# Patient Record
Sex: Female | Born: 1960 | Race: White | Hispanic: No | Smoking: Never smoker
Health system: Southern US, Community
[De-identification: ages and names within clinical notes are randomized; demographics above are authoritative.]

## PROBLEM LIST (undated history)

## (undated) DIAGNOSIS — E119 Type 2 diabetes mellitus without complications: Secondary | ICD-10-CM

## (undated) DIAGNOSIS — R6510 Systemic inflammatory response syndrome (SIRS) of non-infectious origin without acute organ dysfunction: Principal | ICD-10-CM

## (undated) DIAGNOSIS — E785 Hyperlipidemia, unspecified: Secondary | ICD-10-CM

## (undated) DIAGNOSIS — J45909 Unspecified asthma, uncomplicated: Secondary | ICD-10-CM

## (undated) DIAGNOSIS — N200 Calculus of kidney: Secondary | ICD-10-CM

## (undated) HISTORY — PX: OOPHORECTOMY: SHX86

## (undated) HISTORY — PX: TONSILLECTOMY AND ADENOIDECTOMY: SUR1326

## (undated) HISTORY — PX: CHOLECYSTECTOMY: SHX55

## (undated) HISTORY — DX: Systemic inflammatory response syndrome (sirs) of non-infectious origin without acute organ dysfunction: R65.10

## (undated) HISTORY — PX: ABDOMINAL HYSTERECTOMY: SHX81

## (undated) HISTORY — PX: BREAST REDUCTION SURGERY: SHX8

## (undated) HISTORY — DX: Unspecified asthma, uncomplicated: J45.909

## (undated) HISTORY — DX: Calculus of kidney: N20.0

## (undated) HISTORY — PX: OTHER SURGICAL HISTORY: SHX169

## (undated) HISTORY — DX: Hyperlipidemia, unspecified: E78.5

## (undated) HISTORY — DX: Type 2 diabetes mellitus without complications: E11.9

## (undated) HISTORY — PX: BLADDER SUSPENSION: SHX72

---

## 2009-08-08 ENCOUNTER — Ambulatory Visit: Payer: Self-pay | Admitting: Urology

## 2009-08-29 ENCOUNTER — Ambulatory Visit: Payer: Self-pay | Admitting: Unknown Physician Specialty

## 2009-09-19 ENCOUNTER — Ambulatory Visit: Payer: Self-pay | Admitting: Gastroenterology

## 2010-07-24 ENCOUNTER — Ambulatory Visit: Payer: Self-pay | Admitting: Family Medicine

## 2011-10-11 HISTORY — PX: SPINAL FUSION: SHX223

## 2011-12-11 HISTORY — PX: LITHOTRIPSY: SUR834

## 2012-06-09 DIAGNOSIS — R6510 Systemic inflammatory response syndrome (SIRS) of non-infectious origin without acute organ dysfunction: Secondary | ICD-10-CM

## 2012-06-09 DIAGNOSIS — N200 Calculus of kidney: Secondary | ICD-10-CM

## 2012-06-09 HISTORY — DX: Systemic inflammatory response syndrome (sirs) of non-infectious origin without acute organ dysfunction: R65.10

## 2012-06-09 HISTORY — DX: Calculus of kidney: N20.0

## 2012-07-08 ENCOUNTER — Inpatient Hospital Stay: Payer: Self-pay | Admitting: Internal Medicine

## 2012-07-08 LAB — CBC
HCT: 38.5 % (ref 35.0–47.0)
HGB: 13 g/dL (ref 12.0–16.0)
MCH: 28 pg (ref 26.0–34.0)
MCHC: 33.8 g/dL (ref 32.0–36.0)
Platelet: 260 10*3/uL (ref 150–440)
RBC: 4.65 10*6/uL (ref 3.80–5.20)
RDW: 14.5 % (ref 11.5–14.5)

## 2012-07-08 LAB — BASIC METABOLIC PANEL
Co2: 24 mmol/L (ref 21–32)
EGFR (African American): >60
EGFR (Non-African Amer.): >60
Osmolality: 284 (ref 275–301)
Potassium: 3.8 mmol/L (ref 3.5–5.1)
Sodium: 135 mmol/L — ABNORMAL LOW (ref 136–145)

## 2012-07-08 LAB — URINALYSIS, COMPLETE
Bilirubin,UR: NEGATIVE
Glucose,UR: >=500 mg/dL (ref 0–75)
Ketone: NEGATIVE
Ph: 6 (ref 4.5–8.0)
RBC,UR: 56 /HPF (ref 0–5)
Squamous Epithelial: <1
WBC UR: 169 /HPF (ref 0–5)

## 2012-07-09 LAB — CBC WITH DIFFERENTIAL/PLATELET
Basophil #: 0 10*3/uL (ref 0.0–0.1)
Eosinophil #: 0.1 10*3/uL (ref 0.0–0.7)
Eosinophil %: 1.4 %
Lymphocyte #: 2.1 10*3/uL (ref 1.0–3.6)
Lymphocyte %: 21.4 %
MCH: 28.1 pg (ref 26.0–34.0)
MCHC: 33.4 g/dL (ref 32.0–36.0)
MCV: 84 fL (ref 80–100)
Monocyte #: 0.8 x10 3/mm (ref 0.2–0.9)
Neutrophil %: 68.4 %
Platelet: 190 10*3/uL (ref 150–440)
RBC: 3.64 10*6/uL — ABNORMAL LOW (ref 3.80–5.20)
RDW: 14.5 % (ref 11.5–14.5)

## 2012-07-09 LAB — BASIC METABOLIC PANEL
Calcium, Total: 7.8 mg/dL — ABNORMAL LOW (ref 8.5–10.1)
Co2: 23 mmol/L (ref 21–32)
EGFR (African American): >60
EGFR (Non-African Amer.): >60
Glucose: 219 mg/dL — ABNORMAL HIGH (ref 65–99)
Osmolality: 287 (ref 275–301)
Potassium: 4.4 mmol/L (ref 3.5–5.1)

## 2012-07-10 LAB — HEMOGLOBIN: HGB: 10.2 g/dL — ABNORMAL LOW (ref 12.0–16.0)

## 2012-07-10 LAB — URINE CULTURE

## 2012-07-10 LAB — HEMOGLOBIN A1C: Hemoglobin A1C: 9.1 % — ABNORMAL HIGH (ref 4.2–6.3)

## 2012-07-13 LAB — CULTURE, BLOOD (SINGLE)

## 2012-07-15 LAB — CULTURE, BLOOD (SINGLE)

## 2012-07-16 ENCOUNTER — Telehealth: Payer: Self-pay

## 2012-07-16 NOTE — Telephone Encounter (Signed)
Ok to make appt sooner but please make sure it is a 45 minute appt and that we have hospital records prior to her appt.

## 2012-07-16 NOTE — Telephone Encounter (Signed)
Patient notified as instructed by telephone. Carrie scheduled new pt 45 min appt 07/24/12 at 10:00 am. Faxed record release to [facility] Medical records/ Jacki Cones notified. Pt instructed if condition changes or worsens prior to appt to go to UC or ER. Pt understood.

## 2012-07-16 NOTE — Telephone Encounter (Signed)
Pt has 30 min new pt to establish appt scheduled with Dr Dayton Martes on 08/28/12 at 2 pm. Pt recently hospitalized [facility] with SIRS,AC,pyelonephritis,staghorn rt kidney stone,leukocytosis, hypotension resolved(during hospitalization BP 52/45; at [facility] pts BP 120/70),fluid overload and DM; hgbA1C 9.1. Pt said she is to follow up with medical doctor in one week. Pt said she has never seen medical doctor.  Pt has appt 07/28/12 with endocrinologist at [facility] and f/u appt with urologist. Jamesetta So, office manager advised to send note to Dr Dayton Martes to see if could work in before 08/28/12. Pt advised if condition changed or worsened before heard back from our office to go to UC or ER. Pt verbalized understanding.Please advise.

## 2012-07-24 ENCOUNTER — Ambulatory Visit (INDEPENDENT_AMBULATORY_CARE_PROVIDER_SITE_OTHER): Payer: BC Managed Care – PPO | Admitting: Family Medicine

## 2012-07-24 ENCOUNTER — Other Ambulatory Visit: Payer: Self-pay | Admitting: Family Medicine

## 2012-07-24 ENCOUNTER — Encounter: Payer: Self-pay | Admitting: Family Medicine

## 2012-07-24 VITALS — BP 118/80 | HR 64 | Temp 98.1°F | Ht 59.75 in | Wt 157.0 lb

## 2012-07-24 DIAGNOSIS — R6510 Systemic inflammatory response syndrome (SIRS) of non-infectious origin without acute organ dysfunction: Secondary | ICD-10-CM

## 2012-07-24 DIAGNOSIS — N200 Calculus of kidney: Secondary | ICD-10-CM

## 2012-07-24 DIAGNOSIS — D239 Other benign neoplasm of skin, unspecified: Secondary | ICD-10-CM

## 2012-07-24 DIAGNOSIS — R197 Diarrhea, unspecified: Secondary | ICD-10-CM | POA: Insufficient documentation

## 2012-07-24 DIAGNOSIS — Z800 Family history of malignant neoplasm of digestive organs: Secondary | ICD-10-CM | POA: Insufficient documentation

## 2012-07-24 DIAGNOSIS — N12 Tubulo-interstitial nephritis, not specified as acute or chronic: Secondary | ICD-10-CM | POA: Insufficient documentation

## 2012-07-24 DIAGNOSIS — E119 Type 2 diabetes mellitus without complications: Secondary | ICD-10-CM

## 2012-07-24 DIAGNOSIS — Z808 Family history of malignant neoplasm of other organs or systems: Secondary | ICD-10-CM | POA: Insufficient documentation

## 2012-07-24 DIAGNOSIS — E1165 Type 2 diabetes mellitus with hyperglycemia: Secondary | ICD-10-CM | POA: Insufficient documentation

## 2012-07-24 DIAGNOSIS — E785 Hyperlipidemia, unspecified: Secondary | ICD-10-CM | POA: Insufficient documentation

## 2012-07-24 DIAGNOSIS — D229 Melanocytic nevi, unspecified: Secondary | ICD-10-CM

## 2012-07-24 LAB — CBC WITH DIFFERENTIAL/PLATELET
Basophils Relative: 0.6 % (ref 0.0–3.0)
HCT: 37.6 % (ref 36.0–46.0)
Hemoglobin: 11.9 g/dL — ABNORMAL LOW (ref 12.0–15.0)
Lymphocytes Relative: 29.4 % (ref 12.0–46.0)
Lymphs Abs: 2.6 10*3/uL (ref 0.7–4.0)
MCHC: 31.8 g/dL (ref 30.0–36.0)
Monocytes Relative: 4.8 % (ref 3.0–12.0)
Neutro Abs: 5 10*3/uL (ref 1.4–7.7)
RBC: 4.57 Mil/uL (ref 3.87–5.11)

## 2012-07-24 LAB — COMPREHENSIVE METABOLIC PANEL
AST: 13 U/L (ref 0–37)
BUN: 11 mg/dL (ref 6–23)
Calcium: 10 mg/dL (ref 8.4–10.5)
Chloride: 106 mEq/L (ref 96–112)
Creatinine, Ser: 0.8 mg/dL (ref 0.4–1.2)
GFR: 76.22 mL/min (ref >60.00)
Total Bilirubin: 1 mg/dL (ref 0.3–1.2)

## 2012-07-24 LAB — LIPID PANEL
HDL: 47.4 mg/dL (ref >39.00)
Total CHOL/HDL Ratio: 3
VLDL: 28.4 mg/dL (ref 0.0–40.0)

## 2012-07-24 MED ORDER — LISINOPRIL 2.5 MG PO TABS: 2.5000 mg | ORAL_TABLET | Freq: Every day | ORAL | Status: DC

## 2012-07-24 NOTE — Patient Instructions (Addendum)
It was wonderful to meet you. Please continue to hold your lisinopril until we get your lab results.  Please stop by to see Shirlee Limerick on your way out to set up your dermatology referral.

## 2012-07-24 NOTE — Progress Notes (Signed)
Subjective:    Patient ID: Patricia Golden, female    DOB: 08/18/1961, 51 y.o.   MRN: [id number]  HPI  51 yo with h/o poorly controlled DM, nephrolithiasis here to establish care and for hospital follow up.  Was admitted to [facility] 7/30- 07/10/2012 for Acute pyelonephritis with right staghorn calculous with subsequent SIRS.  Followed by Dr. Evelene Croon.  CT of abdomen/pelvis from 7/30 showed prominent staghorn caculus on right.  CBC- WBC 13.2, Hbg 13.0 UA was positive. Blood cx neg.  Started on Zosyn, received Rocephin, eventually switched to cipro since staghorn stones typically produce proteus infections. Has follow up with Dr. Artis Flock this afternoon.  She has had significant watery diarrhea for past two weeks with some nausea, no vomiting currently. No blood in stool.  Strong FH of colon CA- she had a colonoscopy in 2011.  DM- a1c 9.1 in hospital.  Started on glipizide 2.5 mg daily and was to continue Metformin 1000 mg twice daily. Has rx for lisinopril 2.5 mg daily but advised not to start until she followed up with me. Checking her CBGs daily now- 108 this am.   HLD- on Crestor 20 mg daily.  Patient Active Problem List  Diagnosis  . SIRS (systemic inflammatory response syndrome)  . Staghorn calculus  . DM (diabetes mellitus)  . Hyperlipidemia  . Pyelonephritis   Past Medical History  Diagnosis Date  . SIRS (systemic inflammatory response syndrome) 06/2012    secondary to pyleonephritis  . Staghorn calculus 06/2012    Dr. Evelene Croon is urologist  . DM (diabetes mellitus)   . Hyperlipidemia    Past Surgical History  Procedure Date  . Cholecystectomy   . Tonsillectomy and adenoidectomy   . Abdominal hysterectomy   . Oophorectomy   . Breast reduction surgery   . Tummy tuck   . Bladder suspension    History  Substance Use Topics  . Smoking status: Never Smoker   . Smokeless tobacco: Never Used  . Alcohol Use: Not on file   Family History  Problem Relation Age of Onset    . Diabetes Mother   . Hyperlipidemia Mother   . Diabetes Father   . Hyperlipidemia Father   . Cancer Sister     melanoma, another sister with colon CA at 22   Allergies not on file Current Outpatient Prescriptions on File Prior to Visit  Medication Sig Dispense Refill  . glipiZIDE (GLUCOTROL XL) 2.5 MG 24 hr tablet Take 2.5 mg by mouth daily.      . rosuvastatin (CRESTOR) 20 MG tablet Take 20 mg by mouth daily.       The PMH, PSH, Social History, Family History, Medications, and allergies have been reviewed in [facility], and have been updated if relevant.   Review of Systems See HPI No dysuria No fever No abdominal pain No CP No SOB       Objective:   Physical Exam BP 118/80  Pulse 64  Temp 98.1 F (36.7 C)  Ht 4' 11.75" (1.518 m)  Wt 157 lb (71.215 kg)  BMI 30.92 kg/m2  General:  Well-developed,well-nourished,in no acute distress; alert,appropriate and cooperative throughout examination Head:  normocephalic and atraumatic.   Eyes:  vision grossly intact, pupils equal, pupils round, and pupils reactive to light.   Ears:  R ear normal and L ear normal.   Nose:  no external deformity.   Mouth:  good dentition.   Lungs:  Normal respiratory effort, chest expands symmetrically. Lungs are clear to auscultation, no crackles  or wheezes. Heart:  Normal rate and regular rhythm. S1 and S2 normal without gallop, murmur, click, rub or other extra sounds. Abdomen:  Bowel sounds positive,abdomen soft and non-tender without masses, organomegaly or hernias noted. Msk:  No deformity or scoliosis noted of thoracic or lumbar spine.   Extremities:  No clubbing, cyanosis, edema, or deformity noted with normal full range of motion of all joints.   Neurologic:  alert & oriented X3 and gait normal.   Skin:  Intact without suspicious lesions or rashes Multiple nevi Psych:  Cognition and judgment appear intact. Alert and cooperative with normal attention span and concentration. No apparent  delusions, illusions, hallucinations    Assessment & Plan:   1. SIRS (systemic inflammatory response syndrome)  Resolved.   2. Staghorn calculus  Follow up with Dr. Evelene Croon today to discuss next step.    3. DM (diabetes mellitus)  Deteriorated. Has appt with endocrinologist in September. Continue to hold metformin until we get her lab results back from today ([facility]).   4. Pyelonephritis  Resolved. On last day of cipro. Comprehensive metabolic panel, CBC with Differential  5. Hyperlipidemia  Lipid Panel  6. Diarrhea  New- may be reaction to cipro but given that she received long course of strong abx, including Zosyn, I would like to rule out C. Diff. Clostridium Difficile by PCR  7. Family history of colon cancer    8. Family history of melanoma  Ambulatory referral to Dermatology  9. Multiple nevi  Ambulatory referral to Dermatology

## 2012-07-24 NOTE — Addendum Note (Signed)
Addended by: Alvina Chou on: 07/24/2012 11:06 AM   Modules accepted: Orders

## 2012-07-25 LAB — CLOSTRIDIUM DIFFICILE EIA: CDIFTX: NEGATIVE

## 2012-08-07 ENCOUNTER — Ambulatory Visit: Payer: Self-pay | Admitting: Urology

## 2012-08-28 ENCOUNTER — Ambulatory Visit: Payer: Self-pay | Admitting: Family Medicine

## 2012-08-28 ENCOUNTER — Ambulatory Visit: Payer: Self-pay | Admitting: Urology

## 2012-08-28 LAB — BASIC METABOLIC PANEL
Anion Gap: 8 (ref 7–16)
BUN: 7 mg/dL (ref 7–18)
Chloride: 107 mmol/L (ref 98–107)
Creatinine: 0.65 mg/dL (ref 0.60–1.30)
Potassium: 3.7 mmol/L (ref 3.5–5.1)

## 2012-09-01 ENCOUNTER — Ambulatory Visit: Payer: Self-pay | Admitting: Urology

## 2012-09-04 ENCOUNTER — Ambulatory Visit: Payer: Self-pay | Admitting: Urology

## 2012-09-12 ENCOUNTER — Other Ambulatory Visit: Payer: Self-pay

## 2012-09-12 MED ORDER — GLIPIZIDE ER 2.5 MG PO TB24: 2.5000 mg | ORAL_TABLET | Freq: Every day | ORAL | Status: DC

## 2012-09-12 NOTE — Telephone Encounter (Signed)
Patient notified as instructed by telephone v/m refill sent to [facility] [place].

## 2012-09-12 NOTE — Telephone Encounter (Signed)
Pt has kidney stone surgery and has not seen endocrinologist. Pt request refill glipizide to [facility] [place].

## 2012-09-17 ENCOUNTER — Telehealth: Payer: Self-pay

## 2012-09-17 NOTE — Telephone Encounter (Signed)
Pt request over phone lipid values done 07/24/12; pt needed for wellness assessment form for insurance. Given from lab report.

## 2012-09-23 ENCOUNTER — Ambulatory Visit (INDEPENDENT_AMBULATORY_CARE_PROVIDER_SITE_OTHER): Payer: BC Managed Care – PPO

## 2012-09-23 DIAGNOSIS — Z23 Encounter for immunization: Secondary | ICD-10-CM

## 2012-09-25 ENCOUNTER — Ambulatory Visit: Payer: Self-pay | Admitting: Urology

## 2012-10-28 ENCOUNTER — Ambulatory Visit: Payer: Self-pay | Admitting: Urology

## 2012-10-30 ENCOUNTER — Ambulatory Visit: Payer: Self-pay | Admitting: Urology

## 2012-11-17 ENCOUNTER — Ambulatory Visit: Payer: Self-pay | Admitting: Urology

## 2013-02-02 ENCOUNTER — Telehealth: Payer: Self-pay | Admitting: Family Medicine

## 2013-02-02 ENCOUNTER — Encounter: Payer: Self-pay | Admitting: Internal Medicine

## 2013-02-02 ENCOUNTER — Ambulatory Visit (INDEPENDENT_AMBULATORY_CARE_PROVIDER_SITE_OTHER): Payer: BC Managed Care – PPO | Admitting: Internal Medicine

## 2013-02-02 VITALS — BP 120/80 | HR 80 | Temp 97.9°F | Wt 161.0 lb

## 2013-02-02 DIAGNOSIS — J45909 Unspecified asthma, uncomplicated: Secondary | ICD-10-CM | POA: Insufficient documentation

## 2013-02-02 MED ORDER — BENZONATATE 200 MG PO CAPS: 200.0000 mg | ORAL_CAPSULE | Freq: Three times a day (TID) | ORAL | Status: DC | PRN

## 2013-02-02 MED ORDER — AMOXICILLIN 500 MG PO TABS: 1000.0000 mg | ORAL_TABLET | Freq: Two times a day (BID) | ORAL | Status: DC

## 2013-02-02 NOTE — Telephone Encounter (Signed)
Patient Information:  Caller Name: Suad  Phone: [phone]  Patient: Karyl, Sharrar  Gender: Female  DOB: 1961/11/14  Age: 52 Years  PCP: Ruthe Mannan [facility])  Pregnant: No  Office Follow Up:  Does the office need to follow up with this patient?: No  Instructions For The Office: N/A   Symptoms  Reason For Call & Symptoms: Pt is calling and states that she has had a cold for the last 3 weeks; cough present 3 weeks as well;  cough is not tight and chest pain with coughing only; coughing up green to yellow;  Reviewed Health History In EMR: Yes  Reviewed Medications In EMR: Yes  Reviewed Allergies In EMR: Yes  Reviewed Surgeries / Procedures: Yes  Date of Onset of Symptoms: 01/12/2013  Treatments Tried: Mucinex  Treatments Tried Worked: Yes OB / GYN:  LMP: Unknown  Guideline(s) Used:  Cough  Disposition Per Guideline:   See Today or Tomorrow in Office  Reason For Disposition Reached:   Continuous (nonstop) coughing interferes with work or school and no improvement using cough treatment per Care Advice  Advice Given:  Prevent Dehydration:  Drink adequate liquids.  Coughing Spasms:  Drink warm fluids. Inhale warm mist (Reason: both relax the airway and loosen up the phlegm).  Suck on cough drops or hard candy to coat the irritated throat.  Call Back If:  You become worse.  Appointment Scheduled:  02/02/2013 16:30:00 Appointment Scheduled Provider:  Tillman Abide [facility])

## 2013-02-02 NOTE — Assessment & Plan Note (Signed)
Persistent productive cough Not tight now---uses the inhaler occ Will Rx amoxil---consider change to levaquin if doesn't respond (she has done well with this in the past) tessalon

## 2013-02-02 NOTE — Progress Notes (Signed)
Subjective:    Patient ID: Patricia Golden, female    DOB: [birth year], 52 y.o.   MRN: [id number]  HPI Got sore throat and cold symptoms 2.5 weeks ago Slept a lot last week Now started with thick green mucus Still with cough and has persistent chest heaviness  No fever No clear SOB but has some wheezing Has used her albuterol inhaler Will cough till she gags in AM Not really congested in head---doesn't seem to have PND Sore throat is better ---just some pain with coughing No ear pain now  Has tried mucinex, alka seltzer sinus/cold, tessalon-- mucinex probably gave her the most help  Current Outpatient Prescriptions on File Prior to Visit  Medication Sig Dispense Refill  . Fluticasone-Salmeterol (ADVAIR) 500-50 MCG/DOSE AEPB Inhale 1 puff into the lungs daily.      Marland Kitchen glipiZIDE (GLUCOTROL XL) 2.5 MG 24 hr tablet Take 1 tablet (2.5 mg total) by mouth daily.  30 tablet  6  . metFORMIN (GLUCOPHAGE) 500 MG tablet Take 2 by mouth twice daily      . rosuvastatin (CRESTOR) 20 MG tablet Take 20 mg by mouth daily.       No current facility-administered medications on file prior to visit.    Allergies  Allergen Reactions  . Codeine Nausea And Vomiting    Past Medical History  Diagnosis Date  . SIRS (systemic inflammatory response syndrome) 06/2012    secondary to pyleonephritis  . Staghorn calculus 06/2012    Dr. Evelene Croon is urologist  . DM (diabetes mellitus)   . Hyperlipidemia     Past Surgical History  Procedure Laterality Date  . Cholecystectomy    . Tonsillectomy and adenoidectomy    . Abdominal hysterectomy    . Oophorectomy    . Breast reduction surgery    . Tummy tuck    . Bladder suspension    . Discectomy l5 s1      required fusion 16 days after surgery    Family History  Problem Relation Age of Onset  . Diabetes Mother   . Hyperlipidemia Mother   . Diabetes Father   . Hyperlipidemia Father   . Cancer Sister     melanoma, another sister with colon CA at 22  .  Cancer Maternal Aunt     colon CA  . Cancer Maternal Uncle     colon CA  . Cancer Maternal Grandmother     History   Social History  . Marital Status: Single    Spouse Name: N/A    Number of Children: N/A  . Years of Education: N/A   Occupational History  . Not on file.   Social History Main Topics  . Smoking status: Never Smoker   . Smokeless tobacco: Never Used  . Alcohol Use: Not on file  . Drug Use: Not on file  . Sexually Active: Not on file   Other Topics Concern  . Not on file   Social History Narrative   Divorced, three children- 6, 83, and 70 yo.   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     Review of Systems Some dizziness when she turns her head No rash No vomiting Some loose stools---related to mucinex    Objective:   Physical Exam  Constitutional: She appears well-developed and well-nourished. No distress.  HENT:  Mouth/Throat: Oropharynx is clear and moist. No oropharyngeal exudate.  No sinus tenderness Mild nasal congestion but not really inflamed TMs normal  Neck: Normal range of motion. Neck supple.  Pulmonary/Chest: Effort normal and breath sounds normal. No respiratory distress. She has no wheezes. She has no rales.  Lymphadenopathy:    She has no cervical adenopathy.  Psychiatric: She has a normal mood and affect. Her behavior is normal.          Assessment & Plan:

## 2013-03-03 ENCOUNTER — Telehealth: Payer: Self-pay | Admitting: Family Medicine

## 2013-03-03 NOTE — Telephone Encounter (Signed)
Patient Information:  Caller Name: [place]  Phone: [phone]  Patient: Patricia Golden, Patricia Golden  Gender: Female  DOB: 01-Sep-1961  Age: 52 Years  PCP: Ruthe Mannan [facility])  Pregnant: No  Office Follow Up:  Does the office need to follow up with this patient?: No  Instructions For The Office: N/A  RN Note:  Advised she could also soak in bath with baking soda added to the water to help with itching/burning. Patient verbalized understanding and will call if no improvement in 3 days.  Symptoms  Reason For Call & Symptoms: Reports burning and itching in the vaginal area.  Reviewed Health History In EMR: Yes  Reviewed Medications In EMR: Yes  Reviewed Allergies In EMR: Yes  Reviewed Surgeries / Procedures: Yes  Date of Onset of Symptoms: 02/03/2013  Treatments Tried: Vagisil and Neosporin  Treatments Tried Worked: No OB / GYN:  LMP: Unknown  Guideline(s) Used:  Vaginal Discharge  Disposition Per Guideline:   See Within 3 Days in Office  Reason For Disposition Reached:   Diabetes mellitus or weak immune system (e.g., HIV positive, cancer chemo, splenectomy, organ transplant, chronic steroids)  Advice Given:  Genital Hygiene:   Keep your genital area clean. Wash daily.  Keep your genital area dry. Wear cotton underwear or underwear with a cotton crotch.  Do not use feminine hygiene products.  Antifungal Medication for Vaginal Yeast Infection:   Available in the [place]: Femstat-3, miconazole (Monistat-3), clotrimazole (Gyne-Lotrimin-3, Mycelex-7), butoconazole (Femstat-3).  Read the package instructions thoroughly on all medications that you take.  Call Back If:  There is no improvement after treating yourself for a vaginal yeast infection  You become worse.  Patient Will Follow Care Advice:  YES

## 2013-05-15 LAB — HM DIABETES EYE EXAM: HM Diabetic Eye Exam: NORMAL

## 2013-06-18 ENCOUNTER — Encounter: Payer: Self-pay | Admitting: Family Medicine

## 2013-06-18 ENCOUNTER — Ambulatory Visit (INDEPENDENT_AMBULATORY_CARE_PROVIDER_SITE_OTHER): Payer: BC Managed Care – PPO | Admitting: Family Medicine

## 2013-06-18 ENCOUNTER — Other Ambulatory Visit: Payer: Self-pay

## 2013-06-18 VITALS — BP 110/80 | HR 80 | Temp 98.1°F | Wt 154.0 lb

## 2013-06-18 DIAGNOSIS — R197 Diarrhea, unspecified: Secondary | ICD-10-CM

## 2013-06-18 MED ORDER — METFORMIN HCL 500 MG PO TABS: ORAL_TABLET | ORAL | Status: DC

## 2013-06-18 NOTE — Patient Instructions (Addendum)
Food Poisoning °Food poisoning is an illness caused by something you ate or drank. There are over 250 known causes of food poisoning. However, many other causes are unknown. You can be treated even if the exact cause of your food poisoning is not known. In most cases, food poisoning is mild and lasts 1 to 2 days. However, some cases can be serious, especially for people with low immune systems, the elderly, children and infants, and pregnant women. °CAUSES  °Poor personal hygiene, improper cleaning of storage and preparation areas, and unclean utensils can cause infection or tainting (contamination) of foods. The causes of food poisoning are numerous. Infectious agents, such as viruses, bacteria, or parasites, can cause harm by infecting the intestine and disrupting the absorption of nutrients and water. This can cause diarrhea and lead to dehydration. Viruses are responsible for most of the food poisonings in which an agent is found. Parasites are less likely to cause food poisoning. Toxic agents, such as poisonous mushrooms, marine algae, and pesticides can also cause food poisoning. °· Viral causes of food poisoning include: °· Norovirus. °· Rotavirus. °· Hepatitis A. °· Bacterial causes of food poisoning include: °· Salmonellae. °· Campylobacter. °· Bacillus cereus. °· Escherichia coli (E. coli). °· Shigella. °· Listeria monocytogenes. °· Clostridium botulinum (botulism). °· Vibrio cholerae. °· Parasites that can cause food poisoning include: °· Giardia. °· Cryptosporidium. °· Toxoplasma. °SYMPTOMS °Symptoms may appear several hours or longer after consuming the contaminated food or drink. Symptoms may include: °· Nausea. °· Vomiting. °· Cramping. °· Diarrhea. °· Fever and chills. °· Muscle aches. °DIAGNOSIS °Your caregiver may be able to diagnose food poisoning from a list of what you have recently eaten and results from lab tests. Diagnostic tests may include an exam of the feces. °TREATMENT °In most cases,  treatment focuses on helping to relieve your symptoms and staying well hydrated. Antibiotics are rarely needed. In severe cases, hospitalization may be required. °PREVENTION  °· Wash your hands, food preparation surfaces, and utensils thoroughly before and after handling raw foods. °· Keep refrigerated foods below 40° F (5° C). °· Serve hot foods immediately or keep them heated above 140° F (60° C). °· Divide large volumes of food into small portions for rapid cooling in the refrigerator. Hot, bulky foods in the refrigerator can raise the temperature of other foods that have already cooled. °· Follow approved canning procedures. °· Heat canned foods thoroughly before tasting. °· When in doubt, throw it out. °· Infants, the elderly, women who are pregnant, and people with compromised immune systems are especially susceptible to food poisoning. These people should never consume unpasteurized cheese, unpasteurized cider, raw fish, raw seafood, or raw meat type products. °HOME CARE INSTRUCTIONS  °· Drink enough water and fluids to keep your urine clear or pale yellow. Drink small amounts of fluids frequently and increase as tolerated. °· Ask your caregiver for specific rehydration instructions. °· Avoid: °· Foods high in sugar. °· Alcohol. °· Carbonated drinks. °· Tobacco. °· Juice. °· Caffeine drinks. °· Extremely hot or cold fluids. °· Fatty, greasy foods. °· Too much intake of anything at one time. °· Dairy products until 24 to 48 hours after diarrhea stops. °· You may consume probiotics. Probiotics are active cultures of beneficial bacteria. They may lessen the amount and number of diarrheal stools in adults. Probiotics can be found in yogurt with active cultures and in supplements. °· Wash your hands well to avoid spreading the bacteria. °· Only take over-the-counter or prescription medicines for pain, discomfort,   or fever as directed by your caregiver. Do not give aspirin to children. °· Ask your caregiver if you  should continue to take your regular prescribed and over-the-counter medicines. °SEEK IMMEDIATE MEDICAL CARE IF:  °· You have difficulty breathing, swallowing, talking, or moving. °· You develop blurred vision. °· You are unable to keep fluids down. °· You faint or nearly faint. °· Your eyes turn yellow. °· Vomiting or diarrhea develops or becomes persistent. °· Abdominal pain develops, increases, or localizes in one small area. °· You have a fever. °· The diarrhea becomes excessive or contains blood or mucus. °· You develop excessive weakness, dizziness, or extreme thirst. °· You have no urine for 8 hours. °MAKE SURE YOU:  °· Understand these instructions. °· Will watch your condition. °· Will get help right away if you are not doing well or get worse. °Document Released: 08/24/2004 Document Revised: 02/18/2012 Document Reviewed: 04/12/2011 °ExitCare® Patient Information ©2014 [employer]. ° °

## 2013-06-18 NOTE — Progress Notes (Signed)
Subjective:    Patient ID: Patricia Golden, female    DOB: Sep 30, 1961, 52 y.o.   MRN: [id number]  HPI  52 yo here for vomiting, diarrhea, fever (tmax 100.5) and chills x 4 days.  Started a few hours after eating a chicken biscuit at [employer].  Husband has same symptoms. Vomiting stopped two days ago.  Last fever was two days ago as well.  Still having frequent and loose stools but they are improving. No blood in stool.  No black stools.  She is staying hydrated.  Was drinking pedialyte yesterday.  Appetite is decreased but she is eating.  Patient Active Problem List   Diagnosis Date Noted  . Asthmatic bronchitis 02/02/2013  . Pyelonephritis 07/24/2012  . Diarrhea 07/24/2012  . Family history of colon cancer 07/24/2012  . Family history of melanoma 07/24/2012  . Staghorn calculus   . DM (diabetes mellitus)   . Hyperlipidemia    Past Medical History  Diagnosis Date  . SIRS (systemic inflammatory response syndrome) 06/2012    secondary to pyleonephritis  . Staghorn calculus 06/2012    Dr. Evelene Croon is urologist  . DM (diabetes mellitus)   . Hyperlipidemia    Past Surgical History  Procedure Laterality Date  . Cholecystectomy    . Tonsillectomy and adenoidectomy    . Abdominal hysterectomy    . Oophorectomy    . Breast reduction surgery    . Tummy tuck    . Bladder suspension    . Discectomy l5 s1      required fusion 16 days after surgery   History  Substance Use Topics  . Smoking status: Never Smoker   . Smokeless tobacco: Never Used  . Alcohol Use: Not on file   Family History  Problem Relation Age of Onset  . Diabetes Mother   . Hyperlipidemia Mother   . Diabetes Father   . Hyperlipidemia Father   . Cancer Sister     melanoma, another sister with colon CA at 42  . Cancer Maternal Aunt     colon CA  . Cancer Maternal Uncle     colon CA  . Cancer Maternal Grandmother    Allergies  Allergen Reactions  . Codeine Nausea And Vomiting   Current Outpatient  Prescriptions on File Prior to Visit  Medication Sig Dispense Refill  . Fluticasone-Salmeterol (ADVAIR) 500-50 MCG/DOSE AEPB Inhale 1 puff into the lungs daily.      Marland Kitchen glipiZIDE (GLUCOTROL XL) 2.5 MG 24 hr tablet Take 1 tablet (2.5 mg total) by mouth daily.  30 tablet  6   No current facility-administered medications on file prior to visit.   The PMH, PSH, Social History, Family History, Medications, and allergies have been reviewed in [facility], and have been updated if relevant.   Review of Systems See HPI    Objective:   Physical Exam BP 110/80  Pulse 80  Temp(Src) 98.1 F (36.7 C)  Wt 154 lb (69.854 kg)  BMI 30.31 kg/m2  General:  Well-developed,well-nourished,in no acute distress; alert,appropriate and cooperative throughout examination Head:  normocephalic and atraumatic.   Eyes:  vision grossly intact, pupils equal, pupils round, and pupils reactive to light.   Lungs:  Normal respiratory effort, chest expands symmetrically. Lungs are clear to auscultation, no crackles or wheezes. Heart:  Normal rate and regular rhythm. S1 and S2 normal without gallop, murmur, click, rub or other extra sounds. Abdomen:  Bowel sounds positive,abdomen soft and non-tender without masses, organomegaly or hernias noted. Extremities:  No clubbing,  cyanosis, edema, or deformity noted with normal full range of motion of all joints.   Neurologic:  alert & oriented X3 and gait normal.   Skin:  Intact without suspicious lesions or rashes Psych:  Cognition and judgment appear intact. Alert and cooperative with normal attention span and concentration. No apparent delusions, illusions, hallucinations     Assessment & Plan:  1. Diarrhea With vomiting. Consistent with food poisoning. Reassurance provided- symptoms resolving and she is hydrated. See AVS for supportive care. Call or return to clinic prn if these symptoms worsen or fail to improve as anticipated. The patient indicates understanding of these  issues and agrees with the plan.

## 2013-06-22 ENCOUNTER — Ambulatory Visit (INDEPENDENT_AMBULATORY_CARE_PROVIDER_SITE_OTHER): Payer: BC Managed Care – PPO | Admitting: Family Medicine

## 2013-06-22 ENCOUNTER — Encounter: Payer: Self-pay | Admitting: Family Medicine

## 2013-06-22 ENCOUNTER — Telehealth: Payer: Self-pay | Admitting: Family Medicine

## 2013-06-22 VITALS — BP 120/70 | HR 82 | Temp 98.1°F | Ht 59.75 in | Wt 155.0 lb

## 2013-06-22 DIAGNOSIS — R300 Dysuria: Secondary | ICD-10-CM

## 2013-06-22 DIAGNOSIS — N10 Acute tubulo-interstitial nephritis: Secondary | ICD-10-CM

## 2013-06-22 DIAGNOSIS — N200 Calculus of kidney: Secondary | ICD-10-CM

## 2013-06-22 LAB — POCT URINALYSIS DIPSTICK
Bilirubin, UA: NEGATIVE
Nitrite, UA: NEGATIVE
Protein, UA: 30
Urobilinogen, UA: NEGATIVE
pH, UA: 6

## 2013-06-22 MED ORDER — OXYCODONE-ACETAMINOPHEN 5-325 MG PO TABS: 1.0000 | ORAL_TABLET | ORAL | Status: DC | PRN

## 2013-06-22 MED ORDER — TAMSULOSIN HCL 0.4 MG PO CAPS: 0.4000 mg | ORAL_CAPSULE | Freq: Every day | ORAL | Status: DC

## 2013-06-22 MED ORDER — CIPROFLOXACIN HCL 500 MG PO TABS: 500.0000 mg | ORAL_TABLET | Freq: Two times a day (BID) | ORAL | Status: DC

## 2013-06-22 NOTE — Patient Instructions (Addendum)
REFERRAL: GO THE THE FRONT ROOM AT THE ENTRANCE OF OUR CLINIC, NEAR CHECK IN. ASK FOR Patricia Golden. SHE WILL HELP YOU SET UP YOUR REFERRAL. DATE: TIME:  

## 2013-06-22 NOTE — Telephone Encounter (Signed)
Confidential Office Message [address] [place], [place] [address] p. [phone] f. [phone] To: [facility] (After Hours Triage) Fax: [phone] From: [facility] Date/ Time: 06/22/2013 8:33 AM Taken By: Di Kindle, RN Caller: Demetrios Isaacs Facility: Not Collected Patient: Patricia Golden, Patricia Golden DOB: 1961/09/27 Phone: [phone] Reason for Call: Caller was unable to be reached on callback - Left Message Regarding Appointment: No Appt Date: Appt Time: Unknown Provider: Reason: Details: Outcome: Confidential

## 2013-06-22 NOTE — Progress Notes (Signed)
Nature conservation officer at [facility] [address] [place] [address] Phone: [phone] Fax: [phone]  Date:  06/22/2013   Name:  Patricia Golden   DOB:  29-Jun-1961   MRN:  [id number] Gender: female Age: 52 y.o.  Primary Physician:  Ruthe Mannan, MD  Evaluating MD: Hannah Beat, MD   Chief Complaint: Nephrolithiasis   History of Present Illness:  Patricia Golden is a 52 y.o. pleasant patient who presents with the following:  S/p 4 spine surgeries a few years ago.   Last fall, the patient had a persistent kidney stone and actually had pyelo and developed urosepsis.  Came in last week, thought had food poisoning. Fever spiked and started to develop some sweats in her right side. Last August had a large kidney stone, SIRS, BP really low. When the urologist came in, symptoms now are experiencing. Chills, fever last few days.  Back / flank pain on the right. She also has passed several stones - caught in her strainer.   Patient Active Problem List   Diagnosis Date Noted  . Asthmatic bronchitis 02/02/2013  . Pyelonephritis 07/24/2012  . Diarrhea 07/24/2012  . Family history of colon cancer 07/24/2012  . Family history of melanoma 07/24/2012  . Staghorn calculus   . DM (diabetes mellitus)   . Hyperlipidemia     Past Medical History  Diagnosis Date  . SIRS (systemic inflammatory response syndrome) 06/2012    secondary to pyleonephritis  . Staghorn calculus 06/2012    Dr. Evelene Croon is urologist  . DM (diabetes mellitus)   . Hyperlipidemia     Past Surgical History  Procedure Laterality Date  . Cholecystectomy    . Tonsillectomy and adenoidectomy    . Abdominal hysterectomy    . Oophorectomy    . Breast reduction surgery    . Tummy tuck    . Bladder suspension    . Discectomy l5 s1      required fusion 16 days after surgery    History   Social History  . Marital Status: Divorced    Spouse Name: N/A    Number of Children: N/A  . Years of Education:  N/A   Occupational History  . Not on file.   Social History Main Topics  . Smoking status: Never Smoker   . Smokeless tobacco: Never Used  . Alcohol Use: Not on file  . Drug Use: Not on file  . Sexually Active: Not on file   Other Topics Concern  . Not on file   Social History Narrative   Divorced, three children- 32, 7, and 35 yo.   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    Family History  Problem Relation Age of Onset  . Diabetes Mother   . Hyperlipidemia Mother   . Diabetes Father   . Hyperlipidemia Father   . Cancer Sister     melanoma, another sister with colon CA at 3  . Cancer Maternal Aunt     colon CA  . Cancer Maternal Uncle     colon CA  . Cancer Maternal Grandmother     Allergies  Allergen Reactions  . Codeine Nausea And Vomiting    Medication list has been reviewed and updated.  Outpatient Prescriptions Prior to Visit  Medication Sig Dispense Refill  . Fluticasone-Salmeterol (ADVAIR) 500-50 MCG/DOSE AEPB Inhale 1 puff into the lungs daily.      Marland Kitchen glipiZIDE (GLUCOTROL XL) 2.5 MG 24 hr tablet Take 1 tablet (2.5 mg total) by mouth  daily.  30 tablet  6  . metFORMIN (GLUCOPHAGE) 500 MG tablet Take 2 by mouth twice daily  120 tablet  3   No facility-administered medications prior to visit.    Review of Systems:  ROS: GEN: Acute illness details above GI: Tolerating PO intake, decreased, occ nausea GU: maintaining adequate hydration and urination Pulm: No SOB Interactive and getting along well at home.  Otherwise, ROS is as per the HPI.   Physical Examination: BP 120/70  Pulse 82  Temp(Src) 98.1 F (36.7 C) (Oral)  Ht 4' 11.75" (1.518 m)  Wt 155 lb (70.308 kg)  BMI 30.51 kg/m2  SpO2 99%  Ideal Body Weight: Weight in (lb) to have BMI = 25: 126.7  GEN: WDWN, NAD, Non-toxic, A & O x 3 HEENT: Atraumatic, Normocephalic. Neck supple. No masses, No LAD. Ears and Nose: No external deformity. CV: RRR, No M/G/R. No JVD. No thrill. No extra heart  sounds. PULM: CTA B, no wheezes, crackles, rhonchi. No retractions. No resp. distress. No accessory muscle use. ABD: S, mild RLQ tenderness, R flank tenderness, ND, +BS. No rebound. No HSM. + CVAT on the RIGHT EXTR: No c/c/e NEURO Normal gait.  PSYCH: Normally interactive. Conversant. Not depressed or anxious appearing.  Calm demeanor.    Assessment and Plan:  Acute pyelonephritis  Dysuria - Plan: POCT Urinalysis Dipstick, Urine culture, CBC with Differential, Basic metabolic panel, Hepatic function panel, Ambulatory referral to Urology, CANCELED: CT Abdomen Pelvis Wo Contrast  Nephrolithiasis - Plan: Ambulatory referral to Urology, CANCELED: CT Abdomen Pelvis Wo Contrast  Several days history, intermittent fever, sounds to have pyelo by history and exam. Complicated by complex stone history. Start cipro.   Check labs Flomax and prn pain medication.  Given case and recent history, I went ahead and got urology involved, who will see her 06/23/2013.  Orders Today:  Orders Placed This Encounter  Procedures  . Urine culture  . CBC with Differential  . Basic metabolic panel  . Hepatic function panel  . Ambulatory referral to Urology    Referral Priority:  Routine    Referral Type:  Consultation    Referral Reason:  Specialty Services Required    Requested Specialty:  Urology    Number of Visits Requested:  1  . POCT Urinalysis Dipstick    Updated Medication List: (Includes new medications, updates to list, dose adjustments) Meds ordered this encounter  Medications  . tamsulosin (FLOMAX) 0.4 MG CAPS    Sig: Take 1 capsule (0.4 mg total) by mouth daily.    Dispense:  30 capsule    Refill:  0  . ciprofloxacin (CIPRO) 500 MG tablet    Sig: Take 1 tablet (500 mg total) by mouth 2 (two) times daily.    Dispense:  14 tablet    Refill:  0  . oxyCODONE-acetaminophen (PERCOCET/ROXICET) 5-325 MG per tablet    Sig: Take 1 tablet by mouth every 4 (four) hours as needed for pain.     Dispense:  30 tablet    Refill:  0    Medications Discontinued: There are no discontinued medications.    Signed, Elpidio Galea. Romey Mathieson, MD 06/22/2013 4:04 PM

## 2013-06-23 LAB — CBC WITH DIFFERENTIAL/PLATELET
Eosinophils Absolute: 0.4 10*3/uL (ref 0.0–0.7)
Eosinophils Relative: 3.6 % (ref 0.0–5.0)
HCT: 38.9 % (ref 36.0–46.0)
Lymphs Abs: 2.9 10*3/uL (ref 0.7–4.0)
MCHC: 33.4 g/dL (ref 30.0–36.0)
MCV: 81.7 fl (ref 78.0–100.0)
Monocytes Absolute: 0.3 10*3/uL (ref 0.1–1.0)
Platelets: 316 10*3/uL (ref 150.0–400.0)
RDW: 14.3 % (ref 11.5–14.6)
WBC: 9.9 10*3/uL (ref 4.5–10.5)

## 2013-06-23 LAB — BASIC METABOLIC PANEL
Calcium: 9.7 mg/dL (ref 8.4–10.5)
GFR: 82.72 mL/min (ref >60.00)
Glucose, Bld: 256 mg/dL — ABNORMAL HIGH (ref 70–99)
Sodium: 137 mEq/L (ref 135–145)

## 2013-06-23 LAB — HEPATIC FUNCTION PANEL
Albumin: 3.7 g/dL (ref 3.5–5.2)
Alkaline Phosphatase: 121 U/L — ABNORMAL HIGH (ref 39–117)

## 2013-09-14 ENCOUNTER — Encounter: Payer: Self-pay | Admitting: Family Medicine

## 2013-09-14 ENCOUNTER — Ambulatory Visit (INDEPENDENT_AMBULATORY_CARE_PROVIDER_SITE_OTHER): Payer: BC Managed Care – PPO | Admitting: Family Medicine

## 2013-09-14 VITALS — BP 122/88 | HR 64 | Temp 98.4°F | Wt 158.1 lb

## 2013-09-14 DIAGNOSIS — N12 Tubulo-interstitial nephritis, not specified as acute or chronic: Secondary | ICD-10-CM

## 2013-09-14 DIAGNOSIS — E119 Type 2 diabetes mellitus without complications: Secondary | ICD-10-CM

## 2013-09-14 DIAGNOSIS — E785 Hyperlipidemia, unspecified: Secondary | ICD-10-CM

## 2013-09-14 DIAGNOSIS — Z23 Encounter for immunization: Secondary | ICD-10-CM

## 2013-09-14 LAB — LIPID PANEL
Cholesterol: 299 mg/dL — ABNORMAL HIGH (ref 0–200)
HDL: 50.9 mg/dL (ref >39.00)
Total CHOL/HDL Ratio: 6
Triglycerides: 214 mg/dL — ABNORMAL HIGH (ref 0.0–149.0)
VLDL: 42.8 mg/dL — ABNORMAL HIGH (ref 0.0–40.0)

## 2013-09-14 LAB — HM DIABETES FOOT EXAM: HM Diabetic Foot Exam: NORMAL

## 2013-09-14 LAB — COMPREHENSIVE METABOLIC PANEL
ALT: 34 U/L (ref 0–35)
Alkaline Phosphatase: 84 U/L (ref 39–117)
BUN: 10 mg/dL (ref 6–23)
CO2: 26 mEq/L (ref 19–32)
Calcium: 9.2 mg/dL (ref 8.4–10.5)
Chloride: 102 mEq/L (ref 96–112)
Creatinine, Ser: 0.7 mg/dL (ref 0.4–1.2)
GFR: 90.64 mL/min (ref >60.00)
Glucose, Bld: 299 mg/dL — ABNORMAL HIGH (ref 70–99)
Sodium: 135 mEq/L (ref 135–145)
Total Bilirubin: 1.4 mg/dL — ABNORMAL HIGH (ref 0.3–1.2)
Total Protein: 7 g/dL (ref 6.0–8.3)

## 2013-09-14 LAB — LDL CHOLESTEROL, DIRECT: Direct LDL: 216.4 mg/dL

## 2013-09-14 LAB — MICROALBUMIN / CREATININE URINE RATIO
Creatinine,U: 145.5 mg/dL
Microalb, Ur: 5 mg/dL — ABNORMAL HIGH (ref 0.0–1.9)

## 2013-09-14 MED ORDER — FLUTICASONE-SALMETEROL 500-50 MCG/DOSE IN AEPB: 1.0000 | INHALATION_SPRAY | Freq: Every day | RESPIRATORY_TRACT | Status: DC

## 2013-09-14 MED ORDER — ALBUTEROL SULFATE HFA 108 (90 BASE) MCG/ACT IN AERS: 2.0000 | INHALATION_SPRAY | Freq: Four times a day (QID) | RESPIRATORY_TRACT | Status: DC | PRN

## 2013-09-14 MED ORDER — METFORMIN HCL 1000 MG PO TABS: ORAL_TABLET | ORAL | Status: DC

## 2013-09-14 MED ORDER — ROSUVASTATIN CALCIUM 20 MG PO TABS: 20.0000 mg | ORAL_TABLET | Freq: Every day | ORAL | Status: DC

## 2013-09-14 MED ORDER — GLIPIZIDE ER 2.5 MG PO TB24: 2.5000 mg | ORAL_TABLET | Freq: Every day | ORAL | Status: DC

## 2013-09-14 MED ORDER — METFORMIN HCL 500 MG PO TABS: ORAL_TABLET | ORAL | Status: DC

## 2013-09-14 NOTE — Progress Notes (Signed)
Subjective:    Patient ID: Patricia Golden, female    DOB: 30-Oct-1961, 52 y.o.   MRN: [id number]  HPI  52 yo with h/o poorly controlled DM, nephrolithiasis, HLD here for "med refills."   DM-  Has not returned for follow up. Last a1c was last summer and was 9.1.  On glipizide 2.5 mg daily and was to continue Metformin 1000 mg twice daily.  Admits to forgetting glipizide.  Checking her FSBS irregularly.  Normally in mid 100s.  Denies any episodes of hypoglycemia.  HLD- supposed to be on Crestor 20 mg daily.  Stopped taking it months ago- not sure why.  No side effects. Lab Results  Component Value Date   CHOL 125 07/24/2012   HDL 47.40 07/24/2012   LDLCALC 49 07/24/2012   TRIG 142.0 07/24/2012   CHOLHDL 3 07/24/2012   No recurrent issues with kidney stones.  Patient Active Problem List   Diagnosis Date Noted  . Pyelonephritis 07/24/2012  . Family history of colon cancer 07/24/2012  . Family history of melanoma 07/24/2012  . Staghorn calculus   . DM (diabetes mellitus)   . Hyperlipidemia    Past Medical History  Diagnosis Date  . SIRS (systemic inflammatory response syndrome) 06/2012    secondary to pyleonephritis  . Staghorn calculus 06/2012    Dr. Evelene Croon is urologist  . DM (diabetes mellitus)   . Hyperlipidemia    Past Surgical History  Procedure Laterality Date  . Cholecystectomy    . Tonsillectomy and adenoidectomy    . Abdominal hysterectomy    . Oophorectomy    . Breast reduction surgery    . Tummy tuck    . Bladder suspension    . Discectomy l5 s1      required fusion 16 days after surgery   History  Substance Use Topics  . Smoking status: Never Smoker   . Smokeless tobacco: Never Used  . Alcohol Use: Not on file   Family History  Problem Relation Age of Onset  . Diabetes Mother   . Hyperlipidemia Mother   . Diabetes Father   . Hyperlipidemia Father   . Cancer Sister     melanoma, another sister with colon CA at 42  . Cancer Maternal Aunt     colon  CA  . Cancer Maternal Uncle     colon CA  . Cancer Maternal Grandmother    Allergies  Allergen Reactions  . Codeine Nausea And Vomiting   Current Outpatient Prescriptions on File Prior to Visit  Medication Sig Dispense Refill  . ciprofloxacin (CIPRO) 500 MG tablet Take 1 tablet (500 mg total) by mouth 2 (two) times daily.  14 tablet  0  . Fluticasone-Salmeterol (ADVAIR) 500-50 MCG/DOSE AEPB Inhale 1 puff into the lungs daily.      Marland Kitchen glipiZIDE (GLUCOTROL XL) 2.5 MG 24 hr tablet Take 1 tablet (2.5 mg total) by mouth daily.  30 tablet  6  . metFORMIN (GLUCOPHAGE) 500 MG tablet Take 2 by mouth twice daily  120 tablet  3  . oxyCODONE-acetaminophen (PERCOCET/ROXICET) 5-325 MG per tablet Take 1 tablet by mouth every 4 (four) hours as needed for pain.  30 tablet  0  . tamsulosin (FLOMAX) 0.4 MG CAPS Take 1 capsule (0.4 mg total) by mouth daily.  30 capsule  0   No current facility-administered medications on file prior to visit.   The PMH, PSH, Social History, Family History, Medications, and allergies have been reviewed in [facility], and have been updated if  relevant.   Review of Systems See HPI  No abdominal pain No CP No SOB       Objective:   Physical Exam BP 122/88  Pulse 64  Temp(Src) 98.4 F (36.9 C) (Oral)  Wt 158 lb 1.9 oz (71.723 kg)  BMI 31.13 kg/m2  SpO2 95%  General:  Well-developed,well-nourished,in no acute distress; alert,appropriate and cooperative throughout examination Head:  normocephalic and atraumatic.   Eyes:  vision grossly intact, pupils equal, pupils round, and pupils reactive to light.   Ears:  R ear normal and L ear normal.   Nose:  no external deformity.   Mouth:  good dentition.   Lungs:  Normal respiratory effort, chest expands symmetrically. Lungs are clear to auscultation, no crackles or wheezes. Heart:  Normal rate and regular rhythm. S1 and S2 normal without gallop, murmur, click, rub or other extra sounds. Abdomen:  Bowel sounds positive,abdomen  soft and non-tender without masses, organomegaly or hernias noted. Msk:  No deformity or scoliosis noted of thoracic or lumbar spine.   Extremities:  No clubbing, cyanosis, edema, or deformity noted with normal full range of motion of all joints.   Neurologic:  alert & oriented X3 and gait normal.   Skin:  Intact without suspicious lesions or rashes Multiple nevi Psych:  Cognition and judgment appear intact. Alert and cooperative with normal attention span and concentration. No apparent delusions, illusions, hallucinations Diabetic foot exam: Normal inspection No skin breakdown No calluses  Normal DP pulses Normal sensation to light touch and monofilament Nails normal     Assessment & Plan:   1. DM (diabetes mellitus) Not very compliant with meds but she is working on life style changes. Weight stable. Wt Readings from Last 3 Encounters:  09/14/13 158 lb 1.9 oz (71.723 kg)  06/22/13 155 lb (70.308 kg)  06/18/13 154 lb (69.854 kg)   Discussed importance of medication compliance. Per pt, urology stopped her lisinopril. Check labs and urine micro today. - HM Diabetes Foot Exam - Hemoglobin A1c - Comprehensive metabolic panel - Microalbumin/Creatinine Ratio, Urine  2. Hyperlipidemia Stopped taking statin.  Check lipids today.  Advised she will likely need to restart this. The patient indicates understanding of these issues and agrees with the plan.   - Lipid Profile - Comprehensive metabolic panel   3. Need for prophylactic vaccination and inoculation against influenza  - Flu Vaccine QUAD 36+ mos PF IM (Fluarix)

## 2013-09-14 NOTE — Patient Instructions (Addendum)
Good to see you. We will call you with your lab work. We will decide at that time if we should restart cholesterol medication.

## 2013-09-14 NOTE — Addendum Note (Signed)
Addended by: Dianne Dun on: 09/14/2013 04:16 PM   Modules accepted: Orders

## 2013-09-14 NOTE — Addendum Note (Signed)
Addended by: Dianne Dun on: 09/14/2013 04:13 PM   Modules accepted: Orders

## 2013-09-15 NOTE — Addendum Note (Signed)
Addended by: Dianne Dun on: 09/15/2013 10:41 AM   Modules accepted: Orders

## 2013-09-18 ENCOUNTER — Encounter: Payer: Self-pay | Admitting: Internal Medicine

## 2013-09-18 ENCOUNTER — Encounter: Payer: Self-pay | Admitting: Family Medicine

## 2013-09-18 ENCOUNTER — Ambulatory Visit (INDEPENDENT_AMBULATORY_CARE_PROVIDER_SITE_OTHER): Payer: BC Managed Care – PPO | Admitting: Internal Medicine

## 2013-09-18 ENCOUNTER — Other Ambulatory Visit: Payer: Self-pay | Admitting: *Deleted

## 2013-09-18 VITALS — BP 120/60 | HR 77 | Temp 98.3°F | Resp 12 | Ht 60.0 in | Wt 159.3 lb

## 2013-09-18 DIAGNOSIS — E1140 Type 2 diabetes mellitus with diabetic neuropathy, unspecified: Secondary | ICD-10-CM

## 2013-09-18 DIAGNOSIS — E1142 Type 2 diabetes mellitus with diabetic polyneuropathy: Secondary | ICD-10-CM

## 2013-09-18 DIAGNOSIS — E1149 Type 2 diabetes mellitus with other diabetic neurological complication: Secondary | ICD-10-CM

## 2013-09-18 MED ORDER — LIRAGLUTIDE 18 MG/3ML ~~LOC~~ SOPN: 1.2000 mg | PEN_INJECTOR | Freq: Every day | SUBCUTANEOUS | Status: DC

## 2013-09-18 MED ORDER — INSULIN PEN NEEDLE 32G X 6 MM MISC: Status: DC

## 2013-09-18 NOTE — Patient Instructions (Addendum)
Please return in 3 weeks with your sugar log.  Start Victoza at 0.6 mg x 1 week, then increase to 1.2 mg daily. Take it in am, before breakfast.  Decrease Metformin to 1000 mg 2x a day.  Go online to [email or web address].be. Try the MyFitnessPal app.  PATIENT INSTRUCTIONS FOR TYPE 2 DIABETES:  DIET AND EXERCISE Diet and exercise is an important part of diabetic treatment.  We recommended aerobic exercise in the form of brisk walking (working between 40-60% of maximal aerobic capacity, similar to brisk walking) for 150 minutes per week (such as 30 minutes five days per week) along with 3 times per week performing 'resistance' training (using various gauge rubber tubes with handles) 5-10 exercises involving the major muscle groups (upper body, lower body and core) performing 10-15 repetitions (or near fatigue) each exercise. Start at half the above goal but build slowly to reach the above goals. If limited by weight, joint pain, or disability, we recommend daily walking in a swimming pool with water up to waist to reduce pressure from joints while allow for adequate exercise.    BLOOD GLUCOSES Monitoring your blood glucoses is important for continued management of your diabetes. Please check your blood glucoses 2-4 times a day: fasting, before meals and at bedtime (you can rotate these measurements - e.g. one day check before the 3 meals, the next day check before 2 of the meals and before bedtime, etc.   HYPOGLYCEMIA (low blood sugar) Hypoglycemia is usually a reaction to not eating, exercising, or taking too much insulin/ other diabetes drugs.  Symptoms include tremors, sweating, hunger, confusion, headache, etc. Treat IMMEDIATELY with 15 grams of Carbs:   4 glucose tablets    cup regular juice/soda   2 tablespoons raisins   4 teaspoons sugar   1 tablespoon honey Recheck blood glucose in 15 mins and repeat above if still symptomatic/blood glucose <100. Please contact our office at  [phone] if you have questions about how to next handle your insulin.  RECOMMENDATIONS TO REDUCE YOUR RISK OF DIABETIC COMPLICATIONS: * Take your prescribed MEDICATION(S). * Follow a DIABETIC diet: Complex carbs, fiber rich foods, heart healthy fish twice weekly, (monounsaturated and polyunsaturated) fats * AVOID saturated/trans fats, high fat foods, >2,300 mg salt per day. * EXERCISE at least 5 times a week for 30 minutes or preferably daily.  * DO NOT SMOKE OR DRINK more than 1 drink a day. * Check your FEET every day. Do not wear tightfitting shoes. Contact us if you develop an ulcer * See your EYE doctor once a year or more if needed * Get a FLU shot once a year * Get a PNEUMONIA vaccine once before and once after age 70 years  GOALS:  * Your Hemoglobin A1c of <7%  * fasting sugars need to be <130 * after meals sugars need to be <180 (2h after you start eating) * Your Systolic BP should be 140 or lower  * Your Diastolic BP should be 80 or lower  * Your HDL (Good Cholesterol) should be 40 or higher  * Your LDL (Bad Cholesterol) should be 100 or lower  * Your Triglycerides should be 150 or lower  * Your Urine microalbumin (kidney function) should be <30 * Your Body Mass Index should be 25 or lower   We will be glad to help you achieve these goals. Our telephone number is: [phone].

## 2013-09-18 NOTE — Progress Notes (Signed)
Patient ID: Patricia Golden, female   DOB: 17-Apr-1961, 52 y.o.   MRN: [id number]  HPI: Patricia Golden is a 52 y.o.-year-old female, referred by her PCP, Dr. Dayton Martes , for management of DM2, non-insulin-dependent, uncontrolled, with complications (peripheral neuropathy).  Patient has been diagnosed with diabetes in 2007; she has not been on insulin before. Last hemoglobin A1c was: Lab Results  Component Value Date   HGBA1C 11.7* 09/14/2013  Prev. ~9%.  Pt is on a regimen of: - Metformin 2000 mg po bid - increased from 1000 bid recently! - Glipizide XL 2.5 mg - admitted at the last appointment with PCP that she can forget this one She was on Actos >> generalized pain and weight gain.  Pt checks her sugars 3-4 x a day now, before last week was checking 2x a week a day and they are: - am: 100-300 or higher - before lunch: 100-300s - before dinner: 100-300s - bedtime: 200-300s Rarely lows. Lowest sugar was 72; she has hypoglycemia awareness at 70-75. Highest sugar was high 300s.   Pt's meals are: - Breakfast: oatmeal or bagel - Lunch: sandwich - Dinner: chicken, potatoes, salads, a lot of fast foods - eats dinner late as gets home late and easy to stop on the way to pick up fast food - Snacks: 3-4 a day A lot of fast food as she works long hours. Was able to lose 16 lbs since last year.  - no CKD, last BUN/creatinine:  Lab Results  Component Value Date   BUN 10 09/14/2013   CREATININE 0.7 09/14/2013  last ACR 3.4 on 09/14/2013. Not on ACE inhibitors. - Has a very high LDL, last set of lipids: Lab Results  Component Value Date   CHOL 299* 09/14/2013   HDL 50.90 09/14/2013   LDLCALC 49 07/24/2012   LDLDIRECT 216.4 09/14/2013   TRIG 214.0* 09/14/2013   CHOLHDL 6 09/14/2013  She restarted on Crestor.  - last eye exam was in 05/2013. No DR. Has eye exam yearly. - no numbness and tingling in her feet. Foot exam normal as performed by PCP on 09/14/2013.   Pt has FH of DM in both parents, PGM,  sister and daughter.  - prediabetic.  ROS: Constitutional: + both weight gain/loss, + fatigue, + hot flashes, poor sleep Eyes: + blurry vision, no xerophthalmia ENT: no sore throat, no nodules palpated in throat, no dysphagia/odynophagia, no hoarseness Cardiovascular: no CP/SOB/palpitations/leg swelling Respiratory: no cough/SOB/+wheezing Gastrointestinal: no N/V/+D/noC/+heartburn Musculoskeletal: no muscle/joint aches Skin: no rashes Neurological: no tremors/numbness/tingling/dizziness, +headache Psychiatric: no depression/anxiety  Past Medical History  Diagnosis Date  . SIRS (systemic inflammatory response syndrome) 06/2012    secondary to pyleonephritis  . Staghorn calculus 06/2012    Dr. Evelene Croon is urologist  . DM (diabetes mellitus)   . Hyperlipidemia    Past Surgical History  Procedure Laterality Date  . Cholecystectomy    . Tonsillectomy and adenoidectomy    . Abdominal hysterectomy    . Oophorectomy    . Breast reduction surgery    . Tummy tuck    . Bladder suspension    . Discectomy l5 s1      required fusion 16 days after surgery  . Spinal fusion  10/2011    L5.S1  . Lithotripsy  2013    7 procedures    History   Social History  . Marital Status: Divorced    Spouse Name: N/A    Number of Children: 3   Social History Main Topics  .  Smoking status: Never Smoker   . Smokeless tobacco: Never Used  . Alcohol Use: Yes  . Drug Use: No  . Sexual Activity: Yes    Partners: Male   Social History Narrative   Divorced, three children- 51, 28, and 67 yo.   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 albuterol (PROVENTIL HFA;VENTOLIN HFA) 108 (90 BASE) MCG/ACT inhaler Inhale 2 puffs into the lungs every 6 (six) hours as needed for wheezing.  1 Inhaler  0  . Fluticasone-Salmeterol (ADVAIR DISKUS) 500-50 MCG/DOSE AEPB  Inhale 1 puff into the lungs daily.  60 each  3  . glipiZIDE (GLUCOTROL XL) 2.5 MG 24 hr tablet Take 1 tablet (2.5 mg total) by mouth daily.  30 tablet  6  . metFORMIN (GLUCOPHAGE) 1000 MG tablet Take 2 by mouth twice daily  120 tablet  3  . rosuvastatin (CRESTOR) 20 MG tablet Take 1 tablet (20 mg total) by mouth daily.  30 tablet  3   No current facility-administered medications on file prior to visit.   Allergies  Allergen Reactions  . Codeine Nausea And Vomiting   Family History  Problem Relation Age of Onset  . Diabetes Mother   . Hyperlipidemia Mother   . Cancer Mother 32    bio duct cancer/liver/2008  . Diabetes Father   . Hyperlipidemia Father   . Heart disease Father     died heart attack; 05-12-2005  . Cancer Sister     melanoma, another sister with colon CA at 28  . Cancer Maternal Aunt     colon CA  . Cancer Maternal Uncle     colon CA  . Cancer Maternal Grandmother    PE: BP 120/60  Pulse 77  Temp(Src) 98.3 F (36.8 C) (Oral)  Resp 12  Ht 5' (1.524 m)  Wt 159 lb 4.8 oz (72.258 kg)  BMI 31.11 kg/m2  SpO2 97% Wt Readings from Last 3 Encounters:  09/18/13 159 lb 4.8 oz (72.258 kg)  09/14/13 158 lb 1.9 oz (71.723 kg)  06/22/13 155 lb (70.308 kg)   Constitutional: overweight, in NAD Eyes: PERRLA, EOMI, no exophthalmos ENT: moist mucous membranes, no thyromegaly, no cervical lymphadenopathy Cardiovascular: RRR, No MRG Respiratory: CTA B Gastrointestinal: abdomen soft, NT, ND, BS+ Musculoskeletal: no deformities, strength intact in all 4 Skin: moist, warm, no rashes Neurological: no tremor with outstretched hands, DTR normal in all 4  ASSESSMENT: 1. DM2, non-insulin-dependent, uncontrolled, with complications - PN  PLAN:  1. Patient with long-standing, recently more uncontrolled diabetes, on oral antidiabetic regimen, which became insufficient. Her sugars are widely fluctuant per her recall, however, it is difficult to evaluate without a sugar log with her  meter. We discussed about starting to check her sugars at different times of the day - check 2 times a day, rotating checks. She is asking me about an injectable non-insulin medication that can help with both diabetes and weight loss, and I believe that  Victoza is a great option for her. We discussed about benefits and possible side effects. - We discussed about options for treatment, and I suggested to:  Please return in 3 weeks with your sugar log.  Start Victoza at 0.6 mg x 1 week, then increase to 1.2 mg daily. Take it in am, before breakfast.  Decrease Metformin to 1000 mg 2x a day. Go  online to [email or web address].be. Try the MyFitnessPal app. - given sugar log and advised how to fill it and to bring it at next appt  - given foot care handout and explained the principles  - given instructions for hypoglycemia management "15-15 rule"  - advised for yearly eye exams - I will refer her to nutrition - pt agrees. We briefly discussed about improving her diet and I gave her some specific examples. - she did have the flu vaccine this fall, at last visit with PCP on 09/14/2013 - Return to clinic in one month with sugar log

## 2013-10-14 ENCOUNTER — Encounter: Payer: Self-pay | Admitting: Internal Medicine

## 2013-10-14 ENCOUNTER — Ambulatory Visit (INDEPENDENT_AMBULATORY_CARE_PROVIDER_SITE_OTHER): Payer: BC Managed Care – PPO | Admitting: Internal Medicine

## 2013-10-14 VITALS — BP 108/58 | HR 76 | Temp 97.9°F | Resp 10 | Wt 156.0 lb

## 2013-10-14 DIAGNOSIS — E559 Vitamin D deficiency, unspecified: Secondary | ICD-10-CM

## 2013-10-14 DIAGNOSIS — D518 Other vitamin B12 deficiency anemias: Secondary | ICD-10-CM

## 2013-10-14 DIAGNOSIS — E1140 Type 2 diabetes mellitus with diabetic neuropathy, unspecified: Secondary | ICD-10-CM

## 2013-10-14 DIAGNOSIS — D519 Vitamin B12 deficiency anemia, unspecified: Secondary | ICD-10-CM

## 2013-10-14 DIAGNOSIS — E1149 Type 2 diabetes mellitus with other diabetic neurological complication: Secondary | ICD-10-CM

## 2013-10-14 DIAGNOSIS — E1142 Type 2 diabetes mellitus with diabetic polyneuropathy: Secondary | ICD-10-CM

## 2013-10-14 DIAGNOSIS — E538 Deficiency of other specified B group vitamins: Secondary | ICD-10-CM

## 2013-10-14 LAB — VITAMIN B12: Vitamin B-12: 179 pg/mL — ABNORMAL LOW (ref 211–911)

## 2013-10-14 MED ORDER — LIRAGLUTIDE 18 MG/3ML ~~LOC~~ SOPN: 1.8000 mg | PEN_INJECTOR | Freq: Every day | SUBCUTANEOUS | Status: DC

## 2013-10-14 NOTE — Patient Instructions (Addendum)
Keep up the great work! Please continue with the diet. Increase Victoza to 1.8 mg. Continue Metformin 1000 mg 2x daily. Pleas stop at the lab.

## 2013-10-14 NOTE — Progress Notes (Signed)
Patient ID: Patricia Golden, female   DOB: November 24, 1961, 52 y.o.   MRN: [id number]  HPI: Patricia Golden is a 52 y.o.-year-old female, referred by her PCP, Dr. Dayton Martes , for management of DM2, non-insulin-dependent, uncontrolled, with complications (peripheral neuropathy).  Patient has been diagnosed with diabetes in 2007; she has not been on insulin before. Last hemoglobin A1c was: Lab Results  Component Value Date   HGBA1C 11.7* 09/14/2013  Prev. ~9%.  Pt is on a regimen of: - Metformin 1000 mg po bid - Glipizide XL 2.5 mg - admitted at the last appointment with PCP that she can forget this one She was on Actos >> generalized pain and weight gain.  Pt checks her sugars 3-4 x a day now, before last week was checking 2x a week a day and they are: - am: 100-300 or higher >> 108-181 - 2h after b'fast: 126-178 - before lunch: 100-300s >> 120-154 - 2h after lunch: 134-170 - before dinner: 100-300s >> 154-176 - bedtime: 200-300s >> 136-161 Rarely lows. Lowest sugar was 108; she has hypoglycemia awareness at 70-75. Highest sugar was high 181s.   Pt's meals are: - Breakfast: oatmeal or 1/2 bagel - Lunch: sandwich - Dinner: chicken, potatoes, salads, a lot of fast foods - dinner late as gets home late and easy to stop on the way to pick up fast food - Snacks: 3-4 a day At last visit she was telling me she had lot of fast food as she works long hours >> not eating this anymore  Since last visit, she had a lumbar sprain >> was not able to move a lot. On Vicodin prn.  - no CKD, last BUN/creatinine:  Lab Results  Component Value Date   BUN 10 09/14/2013   CREATININE 0.7 09/14/2013  last ACR 3.4 on 09/14/2013. Not on ACE inhibitors. - Has a very high LDL, last set of lipids: Lab Results  Component Value Date   CHOL 299* 09/14/2013   HDL 50.90 09/14/2013   LDLCALC 49 07/24/2012   LDLDIRECT 216.4 09/14/2013   TRIG 214.0* 09/14/2013   CHOLHDL 6 09/14/2013  She restarted on Crestor.  - last eye exam  was in 05/2013. No DR. Has eye exam yearly. - no numbness and tingling in her feet. Foot exam normal as performed by PCP on 09/14/2013.   ROS: Constitutional: no weight gain/loss, no fatigue Eyes: no blurry vision, no xerophthalmia ENT: no sore throat, no nodules palpated in throat, no dysphagia/odynophagia, no hoarseness Cardiovascular: no CP/SOB/palpitations/leg swelling Respiratory: no cough/SOB Gastrointestinal: no N/V/D/C Musculoskeletal: + muscle aches/joint aches Skin: no rashes Neurological: no tremors/numbness/tingling/dizziness  PE: BP 108/58  Pulse 76  Temp(Src) 97.9 F (36.6 C) (Oral)  Resp 10  Wt 156 lb (70.761 kg)  SpO2 95% Wt Readings from Last 3 Encounters:  10/14/13 156 lb (70.761 kg)  09/18/13 159 lb 4.8 oz (72.258 kg)  09/14/13 158 lb 1.9 oz (71.723 kg)   Constitutional: overweight, in NAD Eyes: PERRLA, EOMI, no exophthalmos ENT: moist mucous membranes, no thyromegaly, no cervical lymphadenopathy Cardiovascular: RRR, No MRG Respiratory: CTA B Gastrointestinal: abdomen soft, NT, ND, BS+ Musculoskeletal: no deformities, strength intact in all 4 Skin: moist, warm, no rashes Neurological: no tremor with outstretched hands, DTR normal in all 4  ASSESSMENT: 1. DM2, non-insulin-dependent, uncontrolled, with complications - PN  2. Vit D def  3. Vit B12 def  PLAN:  1. Patient with long-standing, uncontrolled diabetes, on oral - injectable antidiabetic regimen, with Metformin, Glipizide, and Victoza. Her sugars  are much better after starting Victoza at last visit, not quite at goal but definitely much improved. She is also working to improve her diet. - We discussed about options for treatment, and I suggested to:  Patient Instructions  Keep up the great work! Please continue with the diet. Increase Victoza to 1.8 mg. Continue Metformin 1000 mg 2x daily. Pleas stop at the lab.  - she is up to date with yearly eye exams - she did have the flu vaccine  this fall, at appt with PCP on 09/14/2013 - Return to clinic in 3 mo with sugar log   2 and 3 - Pt has numbness and leg pain  - will check a B12 level - she was on im B12 but stopped >1 year ago. Will also recheck vit D since she has a h/o vit D def.  Office Visit on 10/14/2013  Component Date Value Range Status  . Vitamin B-12 10/14/2013 179* 211 - 911 pg/mL Final  . Vit D, 25-Hydroxy 10/14/2013 32  30 - 89 ng/mL Final   Comment: This assay accurately quantifies Vitamin D, which is the sum of the                          25-Hydroxy forms of Vitamin D2 and D3.  Studies have shown that the                          optimum concentration of 25-Hydroxy Vitamin D is 30 ng/mL or higher.                           Concentrations of Vitamin D between 20 and 29 ng/mL are considered to                          be insufficient and concentrations less than 20 ng/mL are considered                          to be deficient for Vitamin D.   Msg sent; Dear Patricia Golden, Your vitamin D is normal, although on the low side. However, your vitamin B12 is very low. You need the B12 injections again. Please give Dr Elmer Sow office a call or a message to resume them. Please let me know if you have any questions. Sincerely, Carlus Pavlov MD

## 2013-10-15 DIAGNOSIS — E559 Vitamin D deficiency, unspecified: Secondary | ICD-10-CM | POA: Insufficient documentation

## 2013-10-15 DIAGNOSIS — E538 Deficiency of other specified B group vitamins: Secondary | ICD-10-CM | POA: Insufficient documentation

## 2013-10-15 LAB — VITAMIN D 25 HYDROXY (VIT D DEFICIENCY, FRACTURES): Vit D, 25-Hydroxy: 32 ng/mL (ref 30–89)

## 2013-10-19 ENCOUNTER — Telehealth: Payer: Self-pay

## 2013-10-19 NOTE — Telephone Encounter (Signed)
Pt left v/m; received mychart message fro Dr Elvera Lennox that 10/14/13 Vit B 12 ( 179) was low and advised pt to call our office to set up Vit B 12 injections. How often do you want pt to get B 12 injections.

## 2013-10-19 NOTE — Telephone Encounter (Signed)
monthly

## 2013-10-26 ENCOUNTER — Encounter: Payer: BC Managed Care – PPO | Attending: Family Medicine | Admitting: Dietician

## 2013-10-26 ENCOUNTER — Encounter: Payer: Self-pay | Admitting: Dietician

## 2013-10-26 VITALS — Ht 60.25 in | Wt 152.7 lb

## 2013-10-26 DIAGNOSIS — E1149 Type 2 diabetes mellitus with other diabetic neurological complication: Secondary | ICD-10-CM | POA: Insufficient documentation

## 2013-10-26 DIAGNOSIS — E1140 Type 2 diabetes mellitus with diabetic neuropathy, unspecified: Secondary | ICD-10-CM

## 2013-10-26 DIAGNOSIS — Z713 Dietary counseling and surveillance: Secondary | ICD-10-CM | POA: Insufficient documentation

## 2013-10-26 NOTE — Patient Instructions (Signed)

## 2013-10-26 NOTE — Progress Notes (Signed)
Appt start time: 0900 end time:  1000.  Assessment:  Patient was seen on  10/26/2013 for individual diabetes education. Patricia Golden has cut down on carbs recently and cut out soda and lost weight in the last year (about 20 pounds). Started leaving work by 7-7:30 PM (instead of later) which is helping her not eat fast food. Works at [employer]	[place]. Lives by herself and trying to cook more.  Current HbA1c: 11.7 on 10/6  Preferred Learning Style:   No preference indicated   Learning Readiness:  Change in progress  MEDICATIONS: Victoza  DIETARY INTAKE:  24-hr recall:  B ( AM): oatmeal with fruit and water, slice french toast with tomato juice on weekends  Snk ( AM): fruit or nuts  L ( PM): cottage cheese with grapes, tuna fish and crackers and carrots with water Snk ( PM): fruit  D ( PM): chicken and salad and veggies, sometimes red meat  Snk ( PM): none Beverages: 10-14 glasses of water each day  Usual physical activity: planning physical therapy in 2 weeks  Estimated energy needs: 1600 calories 180 g carbohydrates 120 g protein 44 g fat  Progress Towards Goal(s):  In progress.   Nutritional Diagnosis:  [place]-2.1 Inpaired nutrition utilization As related to glucose metabolism.  As evidenced by Hgb A1c of 11.7%.    Intervention:  Nutrition counseling provided.  Discussed diabetes disease process and treatment options.  Discussed physiology of diabetes and role of obesity on insulin resistance.  Encouraged moderate weight reduction to improve glucose levels.  Discussed role of medications and diet in glucose control  Provided education on macronutrients on glucose levels.  Provided education on carb counting, importance of regularly scheduled meals/snacks, and meal planning  Discussed effects of physical activity on glucose levels and long-term glucose control.  Recommended 150 minutes of physical activity/week.  Reviewed patient medications.  Discussed role of  medication on blood glucose and possible side effects  Discussed blood glucose monitoring and interpretation.  Discussed recommended target ranges and individual ranges.    Described short-term complications: hyper- and hypo-glycemia.  Discussed causes,symptoms, and treatment options.  Discussed prevention, detection, and treatment of long-term complications.  Discussed the role of prolonged elevated glucose levels on body systems.  Discussed role of stress on blood glucose levels and discussed strategies to manage psychosocial issues.  Discussed recommendations for long-term diabetes self-care.  Established checklist for medical, dental, and emotional self-care.  Teaching Method Utilized:  Visual Auditory  Handouts given during visit include:  Living Well With Diabetes  15 g CHO Snacks  Diabetes Care Schedule  Blood Glucose Monitoring  Diabetes Medications  Barriers to learning/adherence to lifestyle change: none  Diabetes self-care support plan:   [facility] support group  Demonstrated degree of understanding via:  Teach Back   Monitoring/Evaluation:  Dietary intake, exercise, blood sugar monitoring, and body weight in 2 week(s).

## 2013-10-28 ENCOUNTER — Ambulatory Visit (INDEPENDENT_AMBULATORY_CARE_PROVIDER_SITE_OTHER): Payer: BC Managed Care – PPO

## 2013-10-28 DIAGNOSIS — E538 Deficiency of other specified B group vitamins: Secondary | ICD-10-CM

## 2013-10-28 MED ORDER — CYANOCOBALAMIN 1000 MCG/ML IJ SOLN
1000.0000 ug | Freq: Once | INTRAMUSCULAR | Status: AC
  Administered 2013-10-28: 1000 ug via INTRAMUSCULAR

## 2013-10-29 NOTE — Telephone Encounter (Signed)
Pt had been notified and got first B 12 injection 10/28/13.

## 2013-11-19 ENCOUNTER — Ambulatory Visit (INDEPENDENT_AMBULATORY_CARE_PROVIDER_SITE_OTHER): Payer: BC Managed Care – PPO | Admitting: Internal Medicine

## 2013-11-19 ENCOUNTER — Encounter: Payer: Self-pay | Admitting: Internal Medicine

## 2013-11-19 VITALS — BP 122/80 | HR 70 | Temp 98.0°F | Ht 59.0 in | Wt 155.5 lb

## 2013-11-19 DIAGNOSIS — J209 Acute bronchitis, unspecified: Secondary | ICD-10-CM

## 2013-11-19 LAB — URINALYSIS, COMPLETE
Bacteria: NONE SEEN
Bilirubin,UR: NEGATIVE
Nitrite: NEGATIVE
Specific Gravity: 1.009 (ref 1.003–1.030)
Squamous Epithelial: 10
WBC UR: 426 /HPF (ref 0–5)

## 2013-11-19 LAB — CBC
HGB: 13.4 g/dL (ref 12.0–16.0)
MCH: 27.5 pg (ref 26.0–34.0)
MCV: 81 fL (ref 80–100)
Platelet: 246 10*3/uL (ref 150–440)
RBC: 4.87 10*6/uL (ref 3.80–5.20)
WBC: 12.2 10*3/uL — ABNORMAL HIGH (ref 3.6–11.0)

## 2013-11-19 LAB — COMPREHENSIVE METABOLIC PANEL
Albumin: 3.8 g/dL (ref 3.4–5.0)
Anion Gap: 8 (ref 7–16)
BUN: 7 mg/dL (ref 7–18)
Calcium, Total: 9.4 mg/dL (ref 8.5–10.1)
Chloride: 107 mmol/L (ref 98–107)
EGFR (African American): >60
Glucose: 173 mg/dL — ABNORMAL HIGH (ref 65–99)
SGOT(AST): 30 U/L (ref 15–37)
Sodium: 139 mmol/L (ref 136–145)
Total Protein: 7.2 g/dL (ref 6.4–8.2)

## 2013-11-19 MED ORDER — LEVOFLOXACIN 500 MG PO TABS: 500.0000 mg | ORAL_TABLET | Freq: Every day | ORAL | Status: DC

## 2013-11-19 NOTE — Patient Instructions (Signed)
Acute Bronchitis Bronchitis is inflammation of the airways that extend from the windpipe into the lungs (bronchi). The inflammation often causes mucus to develop. This leads to a cough, which is the most common symptom of bronchitis.  In acute bronchitis, the condition usually develops suddenly and goes away over time, usually in a couple weeks. Smoking, allergies, and asthma can make bronchitis worse. Repeated episodes of bronchitis may cause further lung problems.  CAUSES Acute bronchitis is most often caused by the same virus that causes a cold. The virus can spread from person to person (contagious).  SIGNS AND SYMPTOMS   Cough.   Fever.   Coughing up mucus.   Body aches.   Chest congestion.   Chills.   Shortness of breath.   Sore throat.  DIAGNOSIS  Acute bronchitis is usually diagnosed through a physical exam. Tests, such as chest X-rays, are sometimes done to rule out other conditions.  TREATMENT  Acute bronchitis usually goes away in a couple weeks. Often times, no medical treatment is necessary. Medicines are sometimes given for relief of fever or cough. Antibiotics are usually not needed but may be prescribed in certain situations. In some cases, an inhaler may be recommended to help reduce shortness of breath and control the cough. A cool mist vaporizer may also be used to help thin bronchial secretions and make it easier to clear the chest.  HOME CARE INSTRUCTIONS  Get plenty of rest.   Drink enough fluids to keep your urine clear or pale yellow (unless you have a medical condition that requires fluid restriction). Increasing fluids may help thin your secretions and will prevent dehydration.   Only take over-the-counter or prescription medicines as directed by your health care provider.   Avoid smoking and secondhand smoke. Exposure to cigarette smoke or irritating chemicals will make bronchitis worse. If you are a smoker, consider using nicotine gum or skin  patches to help control withdrawal symptoms. Quitting smoking will help your lungs heal faster.   Reduce the chances of another bout of acute bronchitis by washing your hands frequently, avoiding people with cold symptoms, and trying not to touch your hands to your mouth, nose, or eyes.   Follow up with your health care provider as directed.  SEEK MEDICAL CARE IF: Your symptoms do not improve after 1 week of treatment.  SEEK IMMEDIATE MEDICAL CARE IF:  You develop an increased fever or chills.   You have chest pain.   You have severe shortness of breath.  You have bloody sputum.   You develop dehydration.  You develop fainting.  You develop repeated vomiting.  You develop a severe headache. MAKE SURE YOU:   Understand these instructions.  Will watch your condition.  Will get help right away if you are not doing well or get worse. Document Released: 01/03/2005 Document Revised: 07/29/2013 Document Reviewed: 05/19/2013 Exit[employer] Patient Information 2014 [employer].  

## 2013-11-19 NOTE — Progress Notes (Signed)
HPI  Pt presents to the clinic today with c/o cold symptoms x 2 weeks. The worst part is the sore throat and dry cough. She does  produce thick green any sputum. She denies fever but she has had chills. She has tried Robitussin, Mucinex, cough drops and nothing seems to help. The cough is worse at night. She has not had much sleep in 3 nights. She does have a history of allergies and asthma. She does have sick contacts.  Review of Systems      Past Medical History  Diagnosis Date  . SIRS (systemic inflammatory response syndrome) 06/2012    secondary to pyleonephritis  . Staghorn calculus 06/2012    Dr. Evelene Croon is urologist  . DM (diabetes mellitus)   . Hyperlipidemia   . Asthma     Family History  Problem Relation Age of Onset  . Diabetes Mother   . Hyperlipidemia Mother   . Cancer Mother 1    bio duct cancer/liver/2008  . Diabetes Father   . Hyperlipidemia Father   . Heart disease Father     died heart attack; 04/29/05  . Cancer Sister     melanoma, another sister with colon CA at 19  . Cancer Maternal Aunt     colon CA  . Cancer Maternal Uncle     colon CA  . Cancer Maternal Grandmother     History   Social History  . Marital Status: Divorced    Spouse Name: N/A    Number of Children: N/A  . Years of Education: N/A   Occupational History  . Not on file.   Social History Main Topics  . Smoking status: Never Smoker   . Smokeless tobacco: Never Used  . Alcohol Use: Yes  . Drug Use: No  . Sexual Activity: Yes    Partners: Male   Other Topics Concern  . Not on file   Social History Narrative   Divorced, three children- 15, 40, and 7 yo.   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   Regular exercise: some   Caffeine use: coke          Allergies  Allergen Reactions  . Codeine Nausea And Vomiting     Constitutional: Positive headache, fatigue and fever. Denies abrupt weight changes.  HEENT:  Positive sore throat. Denies eye redness, eye pain, pressure behind the  eyes, facial pain, nasal congestion, ear pain, ringing in the ears, wax buildup, runny nose or bloody nose. Respiratory: Positive cough. Denies difficulty breathing or shortness of breath.  Cardiovascular: Denies chest pain, chest tightness, palpitations or swelling in the hands or feet.   No other specific complaints in a complete review of systems (except as listed in HPI above).  Objective:   BP 122/80  Pulse 70  Temp(Src) 98 F (36.7 C) (Oral)  Ht 4\' 11"  (1.499 m)  Wt 155 lb 8 oz (70.534 kg)  BMI 31.39 kg/m2  SpO2 97% Wt Readings from Last 3 Encounters:  11/19/13 155 lb 8 oz (70.534 kg)  10/26/13 152 lb 11.2 oz (69.264 kg)  10/14/13 156 lb (70.761 kg)     General: Appears her stated age, well developed, well nourished in NAD. HEENT: Head: normal shape and size; Eyes: sclera white, no icterus, conjunctiva pink, PERRLA and EOMs intact; Ears: Tm's gray and intact, normal light reflex; Nose: mucosa pink and moist, septum midline; Throat/Mouth: + PND. Teeth present, mucosa erythematous and moist, no exudate noted, no lesions or ulcerations noted.  Neck: Mild  cervical lymphadenopathy. Neck supple, trachea midline. No massses, lumps or thyromegaly present.  Cardiovascular: Normal rate and rhythm. S1,S2 noted.  No murmur, rubs or gallops noted. No JVD or BLE edema. No carotid bruits noted. Pulmonary/Chest: Normal effort and rhonchi noted in the RLL. No respiratory distress. No wheezes, rales or ronchi noted.      Assessment & Plan:   Acute Bronchitis:  Get some rest and drink plenty of water Do salt water gargles for the sore throat eRx for Levaquin x 7 days eRx for Hycodan cough syrup  RTC as needed or if symptoms persist.

## 2013-11-19 NOTE — Progress Notes (Signed)
Pre-visit discussion using our clinic review tool. No additional management support is needed unless otherwise documented below in the visit note.  

## 2013-11-20 ENCOUNTER — Inpatient Hospital Stay: Payer: Self-pay | Admitting: Urology

## 2013-11-21 ENCOUNTER — Ambulatory Visit: Payer: Self-pay

## 2013-11-21 LAB — CBC WITH DIFFERENTIAL/PLATELET
Basophil %: 0.5 %
Eosinophil #: 0.3 10*3/uL (ref 0.0–0.7)
Eosinophil %: 2.2 %
HCT: 31.5 % — ABNORMAL LOW (ref 35.0–47.0)
Lymphocyte #: 1.8 10*3/uL (ref 1.0–3.6)
Lymphocyte %: 15.5 %
MCH: 27.5 pg (ref 26.0–34.0)
MCHC: 33.2 g/dL (ref 32.0–36.0)
MCV: 83 fL (ref 80–100)
Monocyte %: 7.2 %
Neutrophil #: 8.8 10*3/uL — ABNORMAL HIGH (ref 1.4–6.5)
Neutrophil %: 74.6 %
Platelet: 203 10*3/uL (ref 150–440)
RBC: 3.81 10*6/uL (ref 3.80–5.20)
WBC: 11.8 10*3/uL — ABNORMAL HIGH (ref 3.6–11.0)

## 2013-11-22 LAB — URINE CULTURE

## 2013-11-30 ENCOUNTER — Encounter: Payer: Self-pay | Admitting: Family Medicine

## 2013-12-01 ENCOUNTER — Ambulatory Visit (INDEPENDENT_AMBULATORY_CARE_PROVIDER_SITE_OTHER): Payer: BC Managed Care – PPO

## 2013-12-01 ENCOUNTER — Ambulatory Visit: Payer: BC Managed Care – PPO

## 2013-12-01 DIAGNOSIS — E538 Deficiency of other specified B group vitamins: Secondary | ICD-10-CM

## 2013-12-01 MED ORDER — CYANOCOBALAMIN 1000 MCG/ML IJ SOLN
1000.0000 ug | Freq: Once | INTRAMUSCULAR | Status: AC
  Administered 2013-12-01: 1000 ug via INTRAMUSCULAR

## 2013-12-07 ENCOUNTER — Ambulatory Visit: Payer: Self-pay | Admitting: Urology

## 2013-12-11 ENCOUNTER — Other Ambulatory Visit: Payer: Self-pay | Admitting: Family Medicine

## 2013-12-11 DIAGNOSIS — E1165 Type 2 diabetes mellitus with hyperglycemia: Secondary | ICD-10-CM

## 2013-12-11 DIAGNOSIS — E785 Hyperlipidemia, unspecified: Secondary | ICD-10-CM

## 2013-12-11 DIAGNOSIS — IMO0002 Reserved for concepts with insufficient information to code with codable children: Secondary | ICD-10-CM

## 2013-12-11 DIAGNOSIS — E538 Deficiency of other specified B group vitamins: Secondary | ICD-10-CM

## 2013-12-11 DIAGNOSIS — E559 Vitamin D deficiency, unspecified: Secondary | ICD-10-CM

## 2013-12-11 DIAGNOSIS — E1140 Type 2 diabetes mellitus with diabetic neuropathy, unspecified: Secondary | ICD-10-CM

## 2013-12-22 ENCOUNTER — Other Ambulatory Visit (INDEPENDENT_AMBULATORY_CARE_PROVIDER_SITE_OTHER): Payer: BC Managed Care – PPO

## 2013-12-22 DIAGNOSIS — E559 Vitamin D deficiency, unspecified: Secondary | ICD-10-CM

## 2013-12-22 DIAGNOSIS — E1149 Type 2 diabetes mellitus with other diabetic neurological complication: Secondary | ICD-10-CM

## 2013-12-22 DIAGNOSIS — E785 Hyperlipidemia, unspecified: Secondary | ICD-10-CM

## 2013-12-22 DIAGNOSIS — E1165 Type 2 diabetes mellitus with hyperglycemia: Secondary | ICD-10-CM

## 2013-12-22 DIAGNOSIS — E1142 Type 2 diabetes mellitus with diabetic polyneuropathy: Secondary | ICD-10-CM

## 2013-12-22 DIAGNOSIS — E1140 Type 2 diabetes mellitus with diabetic neuropathy, unspecified: Secondary | ICD-10-CM

## 2013-12-22 DIAGNOSIS — IMO0002 Reserved for concepts with insufficient information to code with codable children: Secondary | ICD-10-CM

## 2013-12-22 DIAGNOSIS — E538 Deficiency of other specified B group vitamins: Secondary | ICD-10-CM

## 2013-12-22 LAB — COMPREHENSIVE METABOLIC PANEL
ALBUMIN: 3.9 g/dL (ref 3.5–5.2)
ALT: 49 U/L — AB (ref 0–35)
AST: 34 U/L (ref 0–37)
Alkaline Phosphatase: 106 U/L (ref 39–117)
BUN: 13 mg/dL (ref 6–23)
CALCIUM: 9.2 mg/dL (ref 8.4–10.5)
CHLORIDE: 108 meq/L (ref 96–112)
CO2: 26 mEq/L (ref 19–32)
Creatinine, Ser: 0.7 mg/dL (ref 0.4–1.2)
GFR: 96.72 mL/min (ref >60.00)
Glucose, Bld: 124 mg/dL — ABNORMAL HIGH (ref 70–99)
POTASSIUM: 3.8 meq/L (ref 3.5–5.1)
SODIUM: 141 meq/L (ref 135–145)
Total Bilirubin: 1.4 mg/dL — ABNORMAL HIGH (ref 0.3–1.2)
Total Protein: 7.3 g/dL (ref 6.0–8.3)

## 2013-12-22 LAB — LIPID PANEL
Cholesterol: 235 mg/dL — ABNORMAL HIGH (ref 0–200)
HDL: 44.8 mg/dL (ref >39.00)
Total CHOL/HDL Ratio: 5
Triglycerides: 301 mg/dL — ABNORMAL HIGH (ref 0.0–149.0)
VLDL: 60.2 mg/dL — ABNORMAL HIGH (ref 0.0–40.0)

## 2013-12-22 LAB — HEMOGLOBIN A1C: Hgb A1c MFr Bld: 6.9 % — ABNORMAL HIGH (ref 4.6–6.5)

## 2013-12-22 LAB — LDL CHOLESTEROL, DIRECT: LDL DIRECT: 151.3 mg/dL

## 2013-12-22 LAB — VITAMIN B12: VITAMIN B 12: 363 pg/mL (ref 211–911)

## 2013-12-23 ENCOUNTER — Encounter: Payer: Self-pay | Admitting: Family Medicine

## 2013-12-23 LAB — VITAMIN D 25 HYDROXY (VIT D DEFICIENCY, FRACTURES): Vit D, 25-Hydroxy: 33 ng/mL (ref 30–89)

## 2013-12-28 ENCOUNTER — Ambulatory Visit: Payer: BC Managed Care – PPO | Admitting: Dietician

## 2013-12-29 ENCOUNTER — Encounter: Payer: Self-pay | Admitting: Family Medicine

## 2013-12-29 ENCOUNTER — Ambulatory Visit (INDEPENDENT_AMBULATORY_CARE_PROVIDER_SITE_OTHER): Payer: BC Managed Care – PPO | Admitting: Family Medicine

## 2013-12-29 VITALS — BP 120/74 | HR 70 | Temp 97.8°F | Ht 59.5 in | Wt 143.8 lb

## 2013-12-29 DIAGNOSIS — Z1239 Encounter for other screening for malignant neoplasm of breast: Secondary | ICD-10-CM

## 2013-12-29 DIAGNOSIS — E1149 Type 2 diabetes mellitus with other diabetic neurological complication: Secondary | ICD-10-CM

## 2013-12-29 DIAGNOSIS — N200 Calculus of kidney: Secondary | ICD-10-CM

## 2013-12-29 DIAGNOSIS — Z800 Family history of malignant neoplasm of digestive organs: Secondary | ICD-10-CM

## 2013-12-29 DIAGNOSIS — E1142 Type 2 diabetes mellitus with diabetic polyneuropathy: Secondary | ICD-10-CM

## 2013-12-29 DIAGNOSIS — IMO0002 Reserved for concepts with insufficient information to code with codable children: Secondary | ICD-10-CM

## 2013-12-29 DIAGNOSIS — Z1211 Encounter for screening for malignant neoplasm of colon: Secondary | ICD-10-CM

## 2013-12-29 DIAGNOSIS — E785 Hyperlipidemia, unspecified: Secondary | ICD-10-CM

## 2013-12-29 DIAGNOSIS — E1165 Type 2 diabetes mellitus with hyperglycemia: Secondary | ICD-10-CM

## 2013-12-29 DIAGNOSIS — Z0000 Encounter for general adult medical examination without abnormal findings: Secondary | ICD-10-CM

## 2013-12-29 DIAGNOSIS — E559 Vitamin D deficiency, unspecified: Secondary | ICD-10-CM

## 2013-12-29 DIAGNOSIS — E1140 Type 2 diabetes mellitus with diabetic neuropathy, unspecified: Secondary | ICD-10-CM

## 2013-12-29 DIAGNOSIS — E538 Deficiency of other specified B group vitamins: Secondary | ICD-10-CM

## 2013-12-29 NOTE — Progress Notes (Signed)
Subjective:    Patient ID: Patricia Golden, female    DOB: December 02, 1961, 53 y.o.   MRN: [id number]  HPI  53 yo with h/o poorly controlled DM, nephrolithiasis, HLD here for CPX.  Remote h/o hysterectomy and bilateral oophorectomy.  Unfortunately did have a bout with kidney stones last month- stent placed.  Sees Dr. Eliberto Ivory.  Was not staghorn this time.  DM-  Much improved.  Seeing Dr. Cruzita Lederer and attending diabetes nutrition.  Notes reviewed.  On Metformin, Glipizide, and Victoza.  Checking CBG three to four times daily. Averages in low 100s. No recent episodes of hypoglycemia. Has lost 20 pounds.  Feels great!  +FH of colon CA, overdue for colonoscopy, has been over four years.  Lab Results  Component Value Date   HGBA1C 6.9* 12/22/2013    HLD- restarted Crestor 20 mg daily.  Lipids much improved but not quite yet at goal. Lab Results  Component Value Date   CHOL 235* 12/22/2013   HDL 44.80 12/22/2013   LDLCALC 49 07/24/2012   LDLDIRECT 151.3 12/22/2013   TRIG 301.0* 12/22/2013   CHOLHDL 5 12/22/2013   No recurrent issues with kidney stones.  Patient Active Problem List   Diagnosis Date Noted  . Routine general medical examination at a health care facility 12/29/2013  . Vitamin B12 deficiency 10/15/2013  . Unspecified vitamin D deficiency 10/15/2013  . Pyelonephritis 07/24/2012  . Family history of colon cancer 07/24/2012  . Family history of melanoma 07/24/2012  . Staghorn calculus   . Type 2 diabetes, uncontrolled, with neuropathy   . Hyperlipidemia    Past Medical History  Diagnosis Date  . SIRS (systemic inflammatory response syndrome) 06/2012    secondary to pyleonephritis  . Staghorn calculus 06/2012    Dr. Yves Dill is urologist  . DM (diabetes mellitus)   . Hyperlipidemia   . Asthma    Past Surgical History  Procedure Laterality Date  . Cholecystectomy    . Tonsillectomy and adenoidectomy    . Abdominal hysterectomy    . Oophorectomy    . Breast reduction  surgery    . Tummy tuck    . Bladder suspension    . Discectomy l5 s1      required fusion 16 days after surgery  . Spinal fusion  10/2011    L5.S1  . Lithotripsy  03-20-12    7 procedures    History  Substance Use Topics  . Smoking status: Never Smoker   . Smokeless tobacco: Never Used  . Alcohol Use: Yes   Family History  Problem Relation Age of Onset  . Diabetes Mother   . Hyperlipidemia Mother   . Cancer Mother 19    bio duct cancer/liver/2008  . Diabetes Father   . Hyperlipidemia Father   . Heart disease Father     died heart attack; 2005-03-20  . Cancer Sister     melanoma, another sister with colon CA at 79  . Cancer Maternal Aunt     colon CA  . Cancer Maternal Uncle     colon CA  . Cancer Maternal Grandmother    Allergies  Allergen Reactions  . Codeine Nausea And Vomiting   Current Outpatient Prescriptions on File Prior to Visit  Medication Sig Dispense Refill  . albuterol (PROVENTIL HFA;VENTOLIN HFA) 108 (90 BASE) MCG/ACT inhaler Inhale 2 puffs into the lungs every 6 (six) hours as needed for wheezing.  1 Inhaler  0  . Fluticasone-Salmeterol (ADVAIR DISKUS) 500-50 MCG/DOSE AEPB Inhale 1 puff into  the lungs daily.  60 each  3  . Insulin Pen Needle 32G X 6 MM MISC Use 1 time daily as directed.  100 each  1  . Liraglutide 18 MG/3ML SOPN Inject 1.8 mg into the skin daily before breakfast.  6 pen  5  . metFORMIN (GLUCOPHAGE) 1000 MG tablet 1,000 mg 2 (two) times daily with a meal. Take 2 by mouth twice daily      . rosuvastatin (CRESTOR) 20 MG tablet Take 1 tablet (20 mg total) by mouth daily.  30 tablet  3   No current facility-administered medications on file prior to visit.   The PMH, PSH, Social History, Family History, Medications, and allergies have been reviewed in [facility], and have been updated if relevant.   Review of Systems See HPI  No abdominal pain No CP No SOB   No blood in stool or changes in bowel habits No anxiety or depression No dizziness       Objective:   Physical Exam BP 120/74  Pulse 70  Temp(Src) 97.8 F (36.6 C) (Oral)  Ht 4' 11.5" (1.511 m)  Wt 143 lb 12 oz (65.205 kg)  BMI 28.56 kg/m2  SpO2 98% Wt Readings from Last 3 Encounters:  12/29/13 143 lb 12 oz (65.205 kg)  11/19/13 155 lb 8 oz (70.534 kg)  10/26/13 152 lb 11.2 oz (69.264 kg)     General:  Well-developed,well-nourished,in no acute distress; alert,appropriate and cooperative throughout examination Head:  normocephalic and atraumatic.   Eyes:  vision grossly intact, pupils equal, pupils round, and pupils reactive to light.   Ears:  R ear normal and L ear normal.   Nose:  no external deformity.   Mouth:  good dentition.   Lungs:  Normal respiratory effort, chest expands symmetrically. Lungs are clear to auscultation, no crackles or wheezes. Heart:  Normal rate and regular rhythm. S1 and S2 normal without gallop, murmur, click, rub or other extra sounds. Abdomen:  Bowel sounds positive,abdomen soft and non-tender without masses, organomegaly or hernias noted. Msk:  No deformity or scoliosis noted of thoracic or lumbar spine.   Extremities:  No clubbing, cyanosis, edema, or deformity noted with normal full range of motion of all joints.   Neurologic:  alert & oriented X3 and gait normal.   Skin:  Intact without suspicious lesions or rashes Multiple nevi Psych:  Cognition and judgment appear intact. Alert and cooperative with normal attention span and concentration. No apparent delusions, illusions, hallucinations      Assessment & Plan:

## 2013-12-29 NOTE — Assessment & Plan Note (Signed)
Improved with IM monthly B12. No changes.

## 2013-12-29 NOTE — Assessment & Plan Note (Signed)
Much, much improved! Continue current rx. No changes.

## 2013-12-29 NOTE — Assessment & Plan Note (Signed)
Reviewed preventive care protocols, scheduled due services, and updated immunizations Discussed nutrition, exercise, diet, and healthy lifestyle.  Due for screening colonoscopy, also has family history of colon CA.  Referral placed. Orders Placed This Encounter  Procedures  . MM Digital Screening  . Ambulatory referral to Gastroenterology

## 2013-12-29 NOTE — Assessment & Plan Note (Signed)
Improving.  Continue current dose of Crestor. Recheck labs in 3 months. The patient indicates understanding of these issues and agrees with the plan.

## 2013-12-29 NOTE — Patient Instructions (Signed)
Great to see you. I am so proud of you!  Keep up the great work! Please come back in 3 months for follow up labs.  You can stop by to see Rosaria Ferries on your way out to set up your GI referral for a colonoscopy or we can call you.  Please call the breast center (you can use any of the four locations I showed you) to set up your mammogram.  NO MORE KIDNEY STONES!!  :)

## 2013-12-29 NOTE — Progress Notes (Signed)
Pre-visit discussion using our clinic review tool. No additional management support is needed unless otherwise documented below in the visit note.  

## 2013-12-30 ENCOUNTER — Telehealth: Payer: Self-pay

## 2013-12-30 NOTE — Telephone Encounter (Signed)
Relevant patient education assigned to patient using Emmi. ° °

## 2014-01-05 ENCOUNTER — Ambulatory Visit (INDEPENDENT_AMBULATORY_CARE_PROVIDER_SITE_OTHER): Payer: BC Managed Care – PPO

## 2014-01-05 DIAGNOSIS — E538 Deficiency of other specified B group vitamins: Secondary | ICD-10-CM

## 2014-01-05 MED ORDER — CYANOCOBALAMIN 1000 MCG/ML IJ SOLN
1000.0000 ug | Freq: Once | INTRAMUSCULAR | Status: AC
  Administered 2014-01-05: 1000 ug via INTRAMUSCULAR

## 2014-01-20 ENCOUNTER — Encounter: Payer: Self-pay | Admitting: Internal Medicine

## 2014-01-20 ENCOUNTER — Ambulatory Visit (INDEPENDENT_AMBULATORY_CARE_PROVIDER_SITE_OTHER): Payer: BC Managed Care – PPO | Admitting: Internal Medicine

## 2014-01-20 VITALS — BP 104/68 | HR 68 | Temp 98.5°F | Ht 59.5 in | Wt 145.2 lb

## 2014-01-20 DIAGNOSIS — E1142 Type 2 diabetes mellitus with diabetic polyneuropathy: Secondary | ICD-10-CM

## 2014-01-20 DIAGNOSIS — IMO0002 Reserved for concepts with insufficient information to code with codable children: Secondary | ICD-10-CM

## 2014-01-20 DIAGNOSIS — E1165 Type 2 diabetes mellitus with hyperglycemia: Principal | ICD-10-CM

## 2014-01-20 DIAGNOSIS — E1140 Type 2 diabetes mellitus with diabetic neuropathy, unspecified: Secondary | ICD-10-CM

## 2014-01-20 DIAGNOSIS — E1149 Type 2 diabetes mellitus with other diabetic neurological complication: Secondary | ICD-10-CM

## 2014-01-20 MED ORDER — INSULIN PEN NEEDLE 32G X 6 MM MISC: Status: DC

## 2014-01-20 MED ORDER — LIRAGLUTIDE 18 MG/3ML ~~LOC~~ SOPN: 1.8000 mg | PEN_INJECTOR | Freq: Every day | SUBCUTANEOUS | Status: DC

## 2014-01-20 NOTE — Patient Instructions (Signed)
Continue current regimen. KEEP UP THE GREAT WORK! Please return in 3 months with your sugar log.

## 2014-01-20 NOTE — Progress Notes (Signed)
Patient ID: Patricia Golden, female   DOB: [birth year], 53 y.o.   MRN: [id number]  HPI: Patricia Golden is a 53 y.o.-year-old female, returning for f/u for DM2, dx 2007, non-insulin-dependent, uncontrolled, with complications (peripheral neuropathy). Last visit 3 mo ago.  She had a complicated kidney stone  In 11/2013 >> stent placed >> infection >> stone retrieval.  Last hemoglobin A1c was: Lab Results  Component Value Date   HGBA1C 6.9* 12/22/2013   HGBA1C 11.7* 09/14/2013   Pt is on a regimen of: - Metformin 1000 mg po bid - Victoza 1.8 mg daily She was on Actos >> generalized pain and weight gain. She was on Glipizide XL 2.5 mg>> stopped b/c lows.  Pt checks her sugars 6x a day now (no log) - am: 100-300 or higher >> 108-181 >> 110-125 (highest 130) - 2h after b'fast: 126-178 >> 130-140 - before lunch: 100-300s >> 120-154 >> 130s - 2h after lunch: 134-170 >>130-140 - before dinner: 100-300s >> 154-176 >> up to 140 - bedtime: 200-300s >> 136-161 >>  115-120 Rarely lows. Lowest sugar was 57 x 2; she has hypoglycemia awareness at 70-75. Highest sugar was high 150s.   Sh changed her diet and also now exercises >> lost weight. No more hot flushes. She lost ~25 lbs >> size 14 >> 8.   - no CKD, last BUN/creatinine:  Lab Results  Component Value Date   BUN 13 12/22/2013   CREATININE 0.7 12/22/2013  last ACR 3.4 on 09/14/2013. Not on ACE inhibitors. - Has a very high LDL, last set of lipids: Lab Results  Component Value Date   CHOL 235* 12/22/2013   HDL 44.80 12/22/2013   LDLCALC 49 07/24/2012   LDLDIRECT 151.3 12/22/2013   TRIG 301.0* 12/22/2013   CHOLHDL 5 12/22/2013  She restarted on Crestor.  - last eye exam was in 05/2013. No DR. Has eye exam yearly. - no numbness and tingling in her feet. Foot exam normal as performed by PCP on 09/14/2013.   ROS: Constitutional: + weight loss, no fatigue, no more hot slushes Eyes: no blurry vision, no xerophthalmia ENT: no sore throat, no  nodules palpated in throat, no dysphagia/odynophagia, no hoarseness Cardiovascular: no CP/SOB/palpitations/leg swelling Respiratory: no cough/SOB Gastrointestinal: no N/V/D/C Musculoskeletal: no  muscle aches/joint aches Skin: no rashes Neurological: no tremors/numbness/tingling/dizziness  I reviewed pt's medications, allergies, PMH, social hx, family hx and no changes required, except as mentioned above.  PE: BP 104/68  Pulse 68  Temp(Src) 98.5 F (36.9 C) (Oral)  Ht 4' 11.5" (1.511 m)  Wt 145 lb 4 oz (65.885 kg)  BMI 28.86 kg/m2 Wt Readings from Last 3 Encounters:  01/20/14 145 lb 4 oz (65.885 kg)  12/29/13 143 lb 12 oz (65.205 kg)  11/19/13 155 lb 8 oz (70.534 kg)   Constitutional: overweight, in NAD Eyes: PERRLA, EOMI, no exophthalmos ENT: moist mucous membranes, no thyromegaly, no cervical lymphadenopathy Cardiovascular: RRR, No MRG Respiratory: CTA B Gastrointestinal: abdomen soft, NT, ND, BS+ Musculoskeletal: no deformities, strength intact in all 4 Skin: moist, warm, no rashes Neurological: no tremor with outstretched hands, DTR normal in all 4  ASSESSMENT: 1. DM2, non-insulin-dependent, uncontrolled, with complications - PN  PLAN:  1. Patient with long-standing, uncontrolled diabetes, with impressive recent improvement in weight and diabetes control!  Her sugars are much better after starting Victoza. - We discussed about options for treatment, and I suggested to:  Patient Instructions  Keep up the great work! Please continue with the diet. Continue Victoza 1.8  mg. Continue Metformin 1000 mg 2x daily. - she is up to date with yearly eye exams - she did have the flu vaccine this fall, at appt with PCP on 09/14/2013 - refilled Victoza, pen needles - Return to clinic in 3 mo with sugar log

## 2014-01-20 NOTE — Progress Notes (Signed)
Pre-visit discussion using our clinic review tool. No additional management support is needed unless otherwise documented below in the visit note.  

## 2014-02-09 ENCOUNTER — Ambulatory Visit (INDEPENDENT_AMBULATORY_CARE_PROVIDER_SITE_OTHER): Payer: BC Managed Care – PPO

## 2014-02-09 DIAGNOSIS — E538 Deficiency of other specified B group vitamins: Secondary | ICD-10-CM

## 2014-02-09 MED ORDER — CYANOCOBALAMIN 1000 MCG/ML IJ SOLN
1000.0000 ug | Freq: Once | INTRAMUSCULAR | Status: AC
  Administered 2014-02-09: 1000 ug via INTRAMUSCULAR

## 2014-02-19 ENCOUNTER — Encounter: Payer: Self-pay | Admitting: Family Medicine

## 2014-02-19 ENCOUNTER — Encounter: Payer: Self-pay | Admitting: Internal Medicine

## 2014-02-19 ENCOUNTER — Other Ambulatory Visit: Payer: Self-pay | Admitting: *Deleted

## 2014-02-19 ENCOUNTER — Ambulatory Visit: Payer: BC Managed Care – PPO | Admitting: Dietician

## 2014-02-19 MED ORDER — INSULIN PEN NEEDLE 32G X 6 MM MISC: Status: DC

## 2014-02-19 MED ORDER — GLUCOSE BLOOD VI STRP: ORAL_STRIP | Status: DC

## 2014-02-19 NOTE — Telephone Encounter (Signed)
Pt requested pen needles and testing strips. Sent to pt's pharmacy.

## 2014-02-23 ENCOUNTER — Other Ambulatory Visit: Payer: Self-pay | Admitting: *Deleted

## 2014-02-23 MED ORDER — ONETOUCH DELICA LANCETS 33G MISC: Status: AC

## 2014-03-11 IMAGING — CT CT STONE STUDY
2 of 4 series · 15 of 46 positions shown, 17 images · non-contrast
Comparison: CT of the abdomen and pelvis on 07/08/2012

CLINICAL DATA: Right flank pain began this afternoon. The patient
is voiding small amounts. Past medical history is significant for
kidney stones. History of lithotripsy 7 times last year. Prior
cholecystectomy, hysterectomy.

EXAM:
CT ABDOMEN AND PELVIS WITHOUT
TECHNIQUE: Multidetector CT imaging of the abdomen and pelvis was performed
following the standard protocol without IV contrast.

[Series 2: stone standard full · axial · 0.70mm/px · z∈[-522,-96]mm · 12 of 97 slices shown, 14 images]
[im 8/97  soft-tissue]
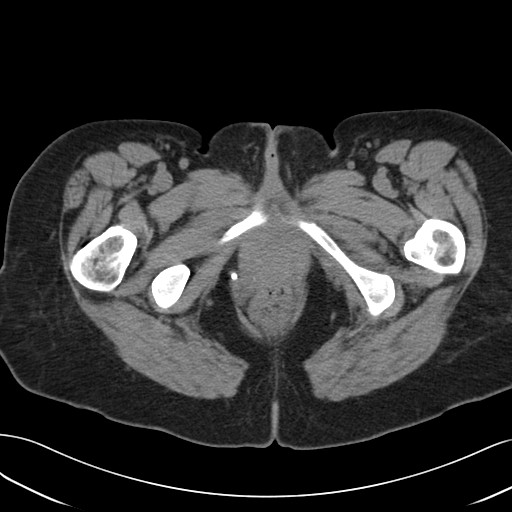
[im 8/97  bone]
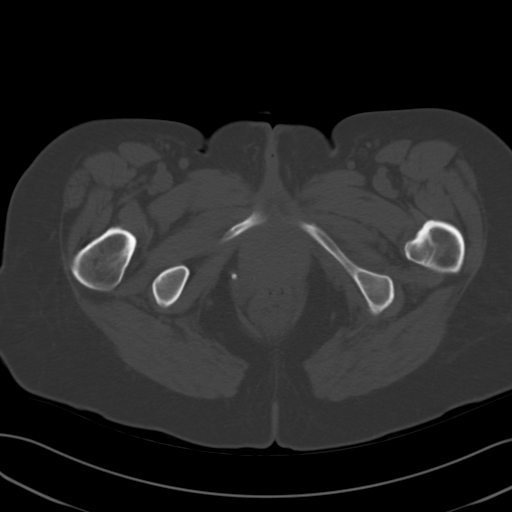
[im 16/97  soft-tissue]
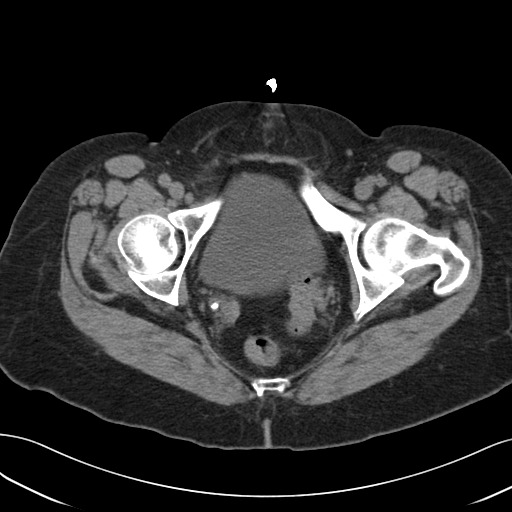
[im 24/97  soft-tissue]
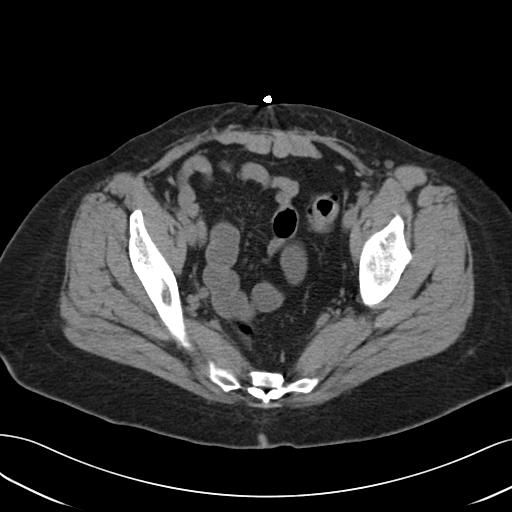
[im 31/97  soft-tissue]
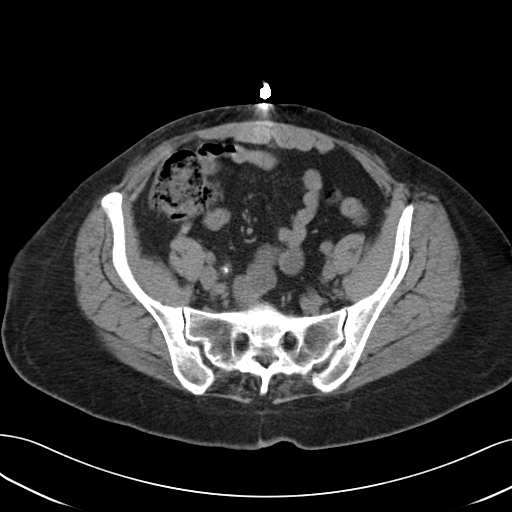
[im 39/97  soft-tissue]
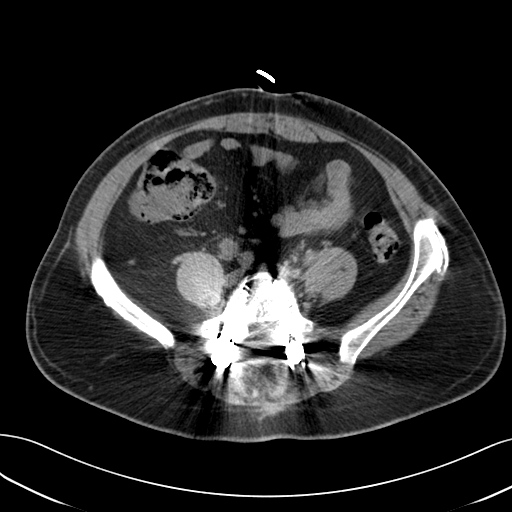
[im 47/97  soft-tissue]
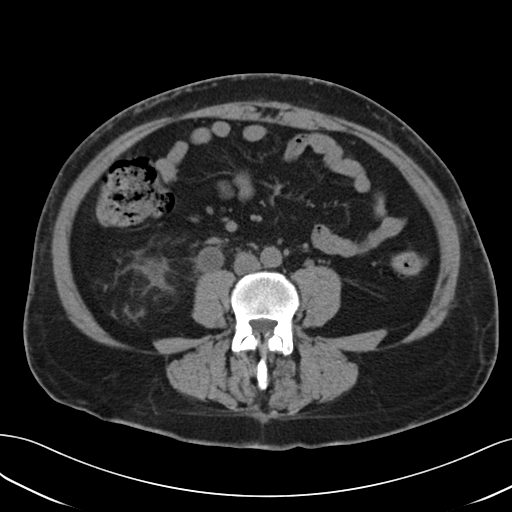
[im 54/97  soft-tissue]
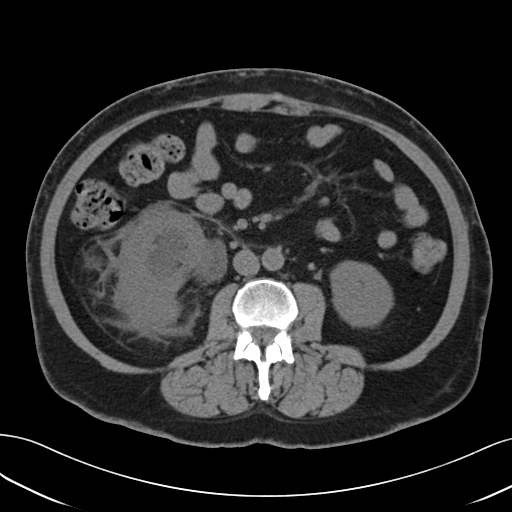
[im 62/97  soft-tissue]
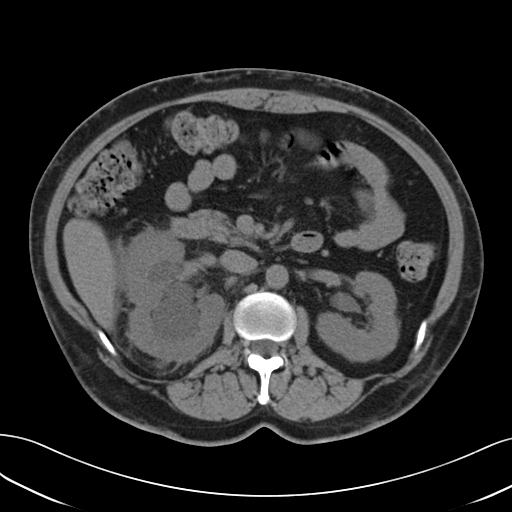
[im 70/97  soft-tissue]
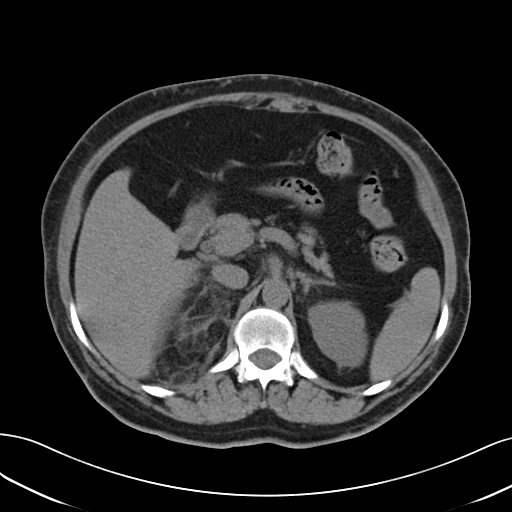
[im 70/97  bone]
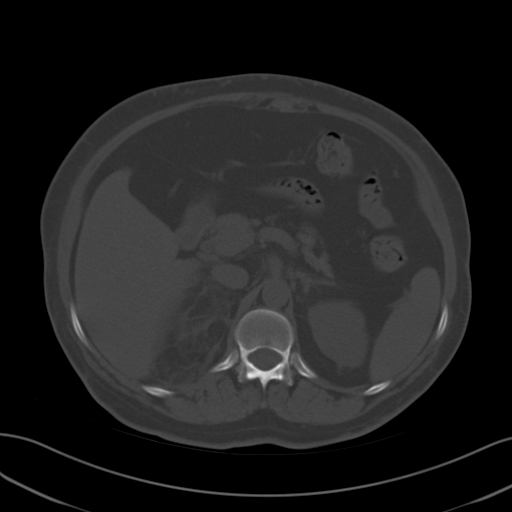
[im 77/97  soft-tissue]
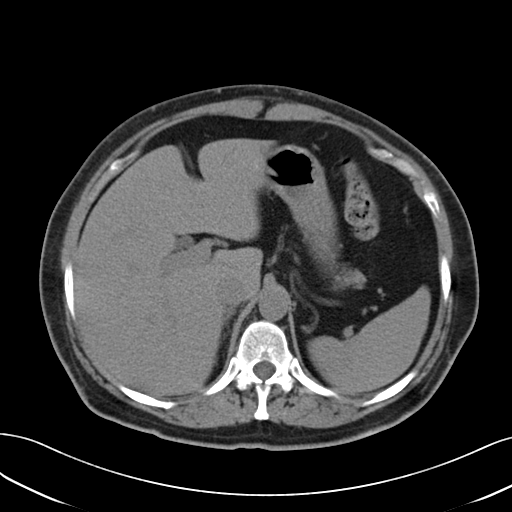
[im 85/97  soft-tissue]
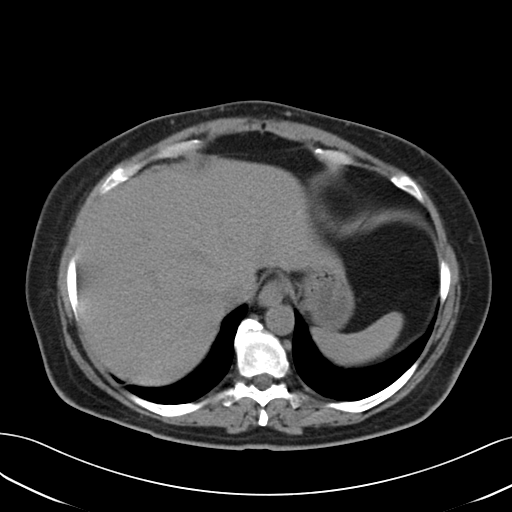
[im 93/97  soft-tissue]
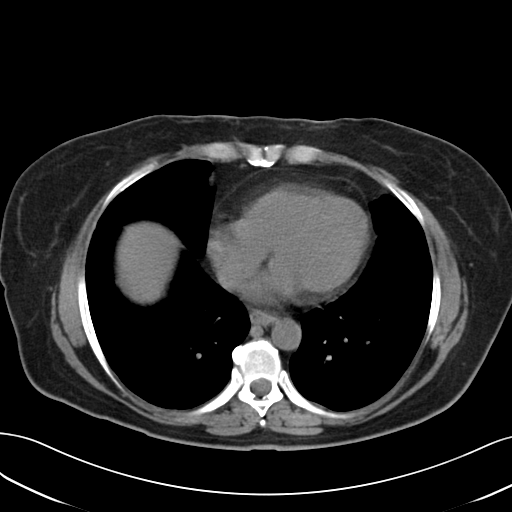

[Series 5: cor stone standard full · coronal · 0.70mm/px · 3 of 126 slices shown]
[im 42/126  soft-tissue]
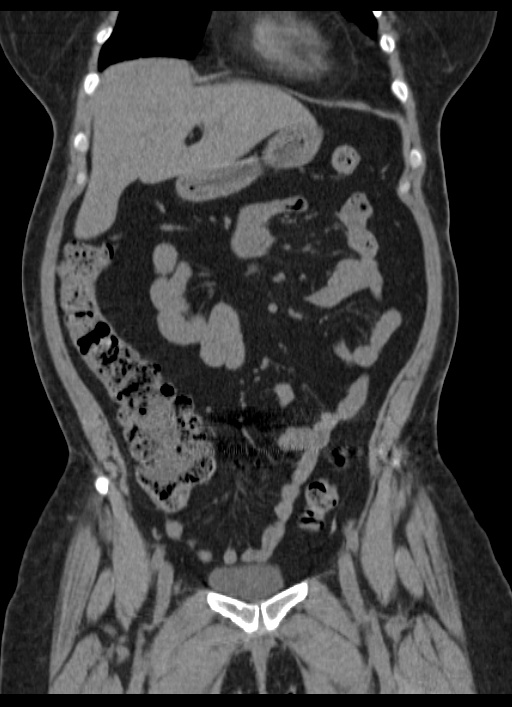
[im 56/126  soft-tissue]
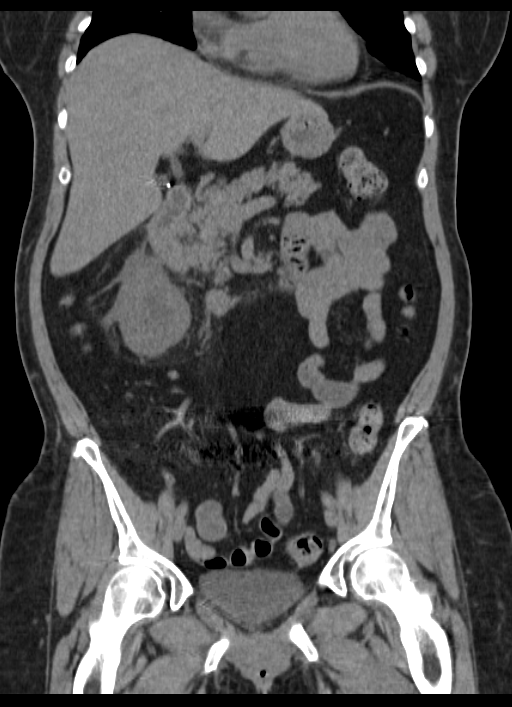
[im 70/126  soft-tissue]
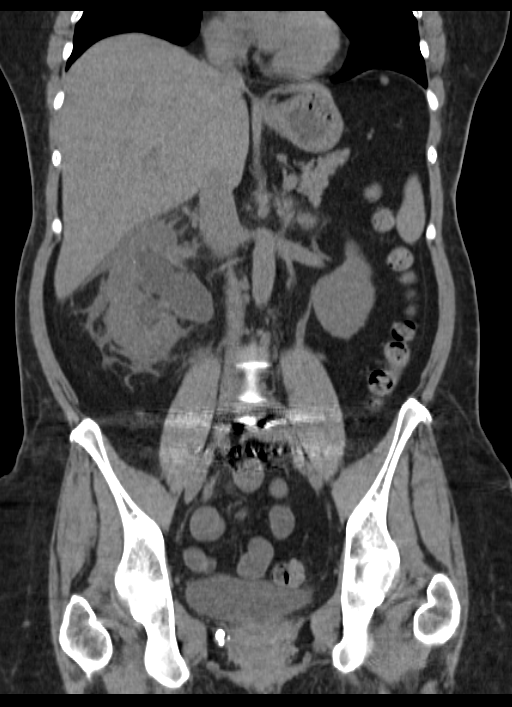

[15 of 46 positions shown; findings below may reference images not displayed]

FINDINGS: Lung bases are unremarkable.

There is marked right-sided hydronephrosis and perinephric
stranding. Dilated, tortuous right ureter contains numerous
calculated. At least 5 stones are identified within the dilated
ureter, measuring 10 mm, 8 mm, 6 mm, 11 mm, and 7 mm from cephalad
to caudal. Large calcifications also identified within the right
renal pelvis, measuring 11 x 14 mm.

No focal abnormality identified within the liver, spleen, pancreas,
adrenal glands, or left kidney. No intrarenal stones or ureteral
stones are identified on the left. The patient has had previous
cholecystectomy.

The stomach and small bowel loops are normal in appearance. The
appendix is normal in appearance. There are scattered diverticula in
the colon. Otherwise, the colon is normal with moderate stool
burden.

The uterus is absent. There is no free pelvic fluid. No adnexal
mass. No evidence for aortic aneurysm. No retroperitoneal or
mesenteric adenopathy. The patient has had previous posterior lumbar
fusion. No suspicious lytic or blastic lesions are identified.
IMPRESSION: 1. Marked right hydronephrosis and perinephric stranding.
2. Dilated of the tortuous right ureter containing at least 5
discrete stones, measuring up to 11 mm in diameter.
3. 11 x 14 mm right renal pelvis stone, not currently obstructing.
4. Diverticulosis without diverticulitis
5. Status post hysterectomy and cholecystectomy.
6. Status post lumbar fusion.

## 2014-03-16 ENCOUNTER — Ambulatory Visit (INDEPENDENT_AMBULATORY_CARE_PROVIDER_SITE_OTHER): Payer: BC Managed Care – PPO

## 2014-03-16 DIAGNOSIS — E538 Deficiency of other specified B group vitamins: Secondary | ICD-10-CM

## 2014-03-16 MED ORDER — CYANOCOBALAMIN 1000 MCG/ML IJ SOLN
1000.0000 ug | Freq: Once | INTRAMUSCULAR | Status: AC
  Administered 2014-03-16: 1000 ug via INTRAMUSCULAR

## 2014-03-23 ENCOUNTER — Other Ambulatory Visit: Payer: Self-pay | Admitting: *Deleted

## 2014-03-23 MED ORDER — ALBUTEROL SULFATE HFA 108 (90 BASE) MCG/ACT IN AERS: 2.0000 | INHALATION_SPRAY | Freq: Four times a day (QID) | RESPIRATORY_TRACT | Status: AC | PRN

## 2014-03-24 ENCOUNTER — Other Ambulatory Visit: Payer: Self-pay | Admitting: Internal Medicine

## 2014-03-24 DIAGNOSIS — E1165 Type 2 diabetes mellitus with hyperglycemia: Principal | ICD-10-CM

## 2014-03-24 DIAGNOSIS — E1140 Type 2 diabetes mellitus with diabetic neuropathy, unspecified: Secondary | ICD-10-CM

## 2014-03-24 DIAGNOSIS — E1149 Type 2 diabetes mellitus with other diabetic neurological complication: Secondary | ICD-10-CM

## 2014-03-24 DIAGNOSIS — IMO0002 Reserved for concepts with insufficient information to code with codable children: Secondary | ICD-10-CM

## 2014-03-29 ENCOUNTER — Other Ambulatory Visit (INDEPENDENT_AMBULATORY_CARE_PROVIDER_SITE_OTHER): Payer: BC Managed Care – PPO

## 2014-03-29 DIAGNOSIS — E1165 Type 2 diabetes mellitus with hyperglycemia: Principal | ICD-10-CM

## 2014-03-29 DIAGNOSIS — IMO0002 Reserved for concepts with insufficient information to code with codable children: Secondary | ICD-10-CM

## 2014-03-29 DIAGNOSIS — E1140 Type 2 diabetes mellitus with diabetic neuropathy, unspecified: Secondary | ICD-10-CM

## 2014-03-29 DIAGNOSIS — E1142 Type 2 diabetes mellitus with diabetic polyneuropathy: Secondary | ICD-10-CM

## 2014-03-29 DIAGNOSIS — E1149 Type 2 diabetes mellitus with other diabetic neurological complication: Secondary | ICD-10-CM

## 2014-03-29 LAB — HEMOGLOBIN A1C: HEMOGLOBIN A1C: 6.4 % (ref 4.6–6.5)

## 2014-04-06 ENCOUNTER — Ambulatory Visit: Payer: BC Managed Care – PPO | Admitting: Dietician

## 2014-04-07 ENCOUNTER — Ambulatory Visit (INDEPENDENT_AMBULATORY_CARE_PROVIDER_SITE_OTHER): Payer: BC Managed Care – PPO | Admitting: Internal Medicine

## 2014-04-07 ENCOUNTER — Encounter: Payer: Self-pay | Admitting: Internal Medicine

## 2014-04-07 ENCOUNTER — Ambulatory Visit: Payer: BC Managed Care – PPO

## 2014-04-07 VITALS — BP 118/78 | HR 60 | Temp 97.8°F | Resp 12 | Wt 148.0 lb

## 2014-04-07 DIAGNOSIS — E1142 Type 2 diabetes mellitus with diabetic polyneuropathy: Secondary | ICD-10-CM

## 2014-04-07 DIAGNOSIS — E1165 Type 2 diabetes mellitus with hyperglycemia: Principal | ICD-10-CM

## 2014-04-07 DIAGNOSIS — E1149 Type 2 diabetes mellitus with other diabetic neurological complication: Secondary | ICD-10-CM

## 2014-04-07 DIAGNOSIS — IMO0002 Reserved for concepts with insufficient information to code with codable children: Secondary | ICD-10-CM

## 2014-04-07 DIAGNOSIS — E1140 Type 2 diabetes mellitus with diabetic neuropathy, unspecified: Secondary | ICD-10-CM

## 2014-04-07 NOTE — Progress Notes (Signed)
Patient ID: Patricia Golden, female   DOB: [birth year], 53 y.o.   MRN: [id number]  HPI: Patricia Golden is a 53 y.o.-year-old female, returning for f/u for DM2, dx 2007, non-insulin-dependent, uncontrolled, with complications (peripheral neuropathy). Last visit 2.5 mo ago.  She is stressed now as her daughter's wedding is coming up in 2 weeks.  Last hemoglobin A1c was: Lab Results  Component Value Date   HGBA1C 6.4 03/29/2014   HGBA1C 6.9* 12/22/2013   HGBA1C 11.7* 09/14/2013   Pt is on a regimen of: - Metformin 1000 mg po bid - Victoza 1.8 mg daily She was on Actos >> generalized pain and weight gain. She was on Glipizide XL 2.5 mg >> stopped b/c lows.  Pt checks her sugars 3x a day now (no log) - am: 100-300 or higher >> 108-181 >> 110-125 (highest 130) >> 90-110 - 2h after b'fast: 126-178 >> 130-140 >> n/c - before lunch: 100-300s >> 120-154 >> 130s >> 120s - 2h after lunch: 134-170 >>130-140 >> n/c - before dinner: 100-300s >> 154-176 >> up to 140 >> n/c - bedtime: 200-300s >> 136-161 >>  115-120 >> up to 155 Rarely lows. Lowest sugar was 90; she has hypoglycemia awareness at 70-75. Highest sugar was high 155.Marland Kitchen   Sh changed her diet and also now exercises >> lost weight. No more hot flushes.  She lost ~25 lbs after starting Victoza >> size 14 >> 8.   - no CKD, last BUN/creatinine:  Lab Results  Component Value Date   BUN 13 12/22/2013   CREATININE 0.7 12/22/2013  last ACR 3.4 on 09/14/2013. Not on ACE inhibitors. - Has a very high LDL, last set of lipids: Lab Results  Component Value Date   CHOL 235* 12/22/2013   HDL 44.80 12/22/2013   LDLCALC 49 07/24/2012   LDLDIRECT 151.3 12/22/2013   TRIG 301.0* 12/22/2013   CHOLHDL 5 12/22/2013  She restarted Crestor.  - last eye exam was in 05/2013. No DR. Has eye exam yearly. - no numbness and tingling in her feet. Foot exam normal as performed by PCP on 09/14/2013.   She had a complicated kidney stone  In 11/2013 >> stent placed >>  infection >> stone retrieval.  ROS: Constitutional: + weight gain, no fatigue Eyes: no blurry vision, no xerophthalmia ENT: no sore throat, no nodules palpated in throat, no dysphagia/odynophagia, no hoarseness Cardiovascular: no CP/SOB/palpitations/leg swelling Respiratory: no cough/SOB Gastrointestinal: no N/V/D/C Musculoskeletal: no  muscle aches/joint aches Skin: no rashes Neurological: no tremors/numbness/tingling/dizziness  I reviewed pt's medications, allergies, PMH, social hx, family hx and no changes required, except as mentioned above.  PE: BP 118/78  Pulse 60  Temp(Src) 97.8 F (36.6 C) (Oral)  Resp 12  Wt 148 lb (67.132 kg)  SpO2 99% Wt Readings from Last 3 Encounters:  04/07/14 148 lb (67.132 kg)  01/20/14 145 lb 4 oz (65.885 kg)  12/29/13 143 lb 12 oz (65.205 kg)   Constitutional: overweight, in NAD Eyes: PERRLA, EOMI, no exophthalmos ENT: moist mucous membranes, no thyromegaly, no cervical lymphadenopathy Cardiovascular: RRR, No MRG Respiratory: CTA B Gastrointestinal: abdomen soft, NT, ND, BS+ Musculoskeletal: no deformities, strength intact in all 4 Skin: moist, warm, no rashes Neurological: no tremor with outstretched hands, DTR normal in all 4  ASSESSMENT: 1. DM2, non-insulin-dependent, uncontrolled, with complications - PN  PLAN:  1. Patient with long-standing, uncontrolled diabetes, with impressive recent improvement in weight and diabetes control! - We discussed about options for treatment, and I suggested to:  Patient  Instructions  Keep up the great work! Continue Victoza 1.8 mg daily Continue Metformin 1000 mg 2x daily. - she is up to date with yearly eye exams - will check a new A1c at next visit - Return to clinic in 3 mo with sugar log

## 2014-04-07 NOTE — Patient Instructions (Addendum)
Keep up the great work! Continue Victoza 1.8 mg daily. Continue Metformin 1000 mg 2x daily.

## 2014-04-18 ENCOUNTER — Encounter: Payer: Self-pay | Admitting: Internal Medicine

## 2014-04-19 ENCOUNTER — Telehealth: Payer: Self-pay

## 2014-04-19 NOTE — Telephone Encounter (Signed)
Pt left v/m requesting change of B 12 appt. Left v/m for pt to cb.

## 2014-04-20 ENCOUNTER — Ambulatory Visit (INDEPENDENT_AMBULATORY_CARE_PROVIDER_SITE_OTHER): Payer: BC Managed Care – PPO

## 2014-04-20 DIAGNOSIS — E538 Deficiency of other specified B group vitamins: Secondary | ICD-10-CM

## 2014-04-20 MED ORDER — CYANOCOBALAMIN 1000 MCG/ML IJ SOLN
1000.0000 ug | Freq: Once | INTRAMUSCULAR | Status: AC
  Administered 2014-04-20: 1000 ug via INTRAMUSCULAR

## 2014-04-20 NOTE — Telephone Encounter (Signed)
Pt going out of town early tomorrow and rescheduled B 12 for today at 2 pm.

## 2014-04-21 ENCOUNTER — Ambulatory Visit: Payer: BC Managed Care – PPO

## 2014-04-21 ENCOUNTER — Ambulatory Visit: Payer: BC Managed Care – PPO | Admitting: Internal Medicine

## 2014-05-20 ENCOUNTER — Ambulatory Visit (INDEPENDENT_AMBULATORY_CARE_PROVIDER_SITE_OTHER): Payer: BC Managed Care – PPO

## 2014-05-20 DIAGNOSIS — E538 Deficiency of other specified B group vitamins: Secondary | ICD-10-CM

## 2014-05-20 MED ORDER — CYANOCOBALAMIN 1000 MCG/ML IJ SOLN
1000.0000 ug | Freq: Once | INTRAMUSCULAR | Status: AC
  Administered 2014-05-20: 1000 ug via INTRAMUSCULAR

## 2014-05-27 ENCOUNTER — Other Ambulatory Visit: Payer: Self-pay | Admitting: Internal Medicine

## 2014-06-15 ENCOUNTER — Encounter: Payer: Self-pay | Admitting: Family Medicine

## 2014-06-16 ENCOUNTER — Ambulatory Visit (INDEPENDENT_AMBULATORY_CARE_PROVIDER_SITE_OTHER): Payer: BC Managed Care – PPO | Admitting: Family Medicine

## 2014-06-16 ENCOUNTER — Ambulatory Visit (INDEPENDENT_AMBULATORY_CARE_PROVIDER_SITE_OTHER)
Admission: RE | Admit: 2014-06-16 | Discharge: 2014-06-16 | Disposition: A | Discharge status: Home / Self Care | Payer: BC Managed Care – PPO | Source: Ambulatory Visit | Attending: Family Medicine | Admitting: Family Medicine

## 2014-06-16 ENCOUNTER — Encounter: Payer: Self-pay | Admitting: Family Medicine

## 2014-06-16 VITALS — BP 116/68 | HR 72 | Temp 98.2°F | Wt 145.8 lb

## 2014-06-16 DIAGNOSIS — M542 Cervicalgia: Secondary | ICD-10-CM

## 2014-06-16 NOTE — Progress Notes (Signed)
Pre visit review using our clinic review tool, if applicable. No additional management support is needed unless otherwise documented below in the visit note. 

## 2014-06-16 NOTE — Patient Instructions (Signed)
Good to see you. I will call you with your xray results.  Please stop by to see Patricia Golden to set up your physical therapy.

## 2014-06-16 NOTE — Progress Notes (Signed)
SUBJECTIVE:  Patricia Golden is a 52 y.o. female who complains of an injury causing neck pain 2 months ago.  Was lifting heavy tables at her daughter's wedding.   The pain is positional with movement of neck with radiation of pain down the arms.   Symptoms have been intermittent since that time. Prior history of neck problems: no prior neck problems. There is numbness, tingling, weakness in the arms.  Current Outpatient Prescriptions on File Prior to Visit  Medication Sig Dispense Refill  . albuterol (PROVENTIL HFA;VENTOLIN HFA) 108 (90 BASE) MCG/ACT inhaler Inhale 2 puffs into the lungs every 6 (six) hours as needed for wheezing.  1 Inhaler  3  . Fluticasone-Salmeterol (ADVAIR DISKUS) 500-50 MCG/DOSE AEPB Inhale 1 puff into the lungs daily.  60 each  3  . glucose blood (ONE TOUCH ULTRA TEST) test strip Use to test blood sugar 6 times daily as instructed  200 each  10  . Liraglutide 18 MG/3ML SOPN Inject 1.8 mg into the skin daily before breakfast.  10 pen  5  . metFORMIN (GLUCOPHAGE) 1000 MG tablet 1,000 mg 2 (two) times daily with a meal. Take 2 by mouth twice daily      . ONETOUCH DELICA LANCETS 01B MISC Use to test blood sugar 6 times daily as instructed.  200 each  prn  . RELION MINI PEN NEEDLES 31G X 6 MM MISC ONE  ONCE DAILY AS DIRECTED  100 each  0  . rosuvastatin (CRESTOR) 20 MG tablet Take 1 tablet (20 mg total) by mouth daily.  30 tablet  3   No current facility-administered medications on file prior to visit.    Allergies  Allergen Reactions  . Codeine Nausea And Vomiting    Past Medical History  Diagnosis Date  . SIRS (systemic inflammatory response syndrome) 06/2012    secondary to pyleonephritis  . Staghorn calculus 06/2012    Dr. Yves Dill is urologist  . DM (diabetes mellitus)   . Hyperlipidemia   . Asthma     Past Surgical History  Procedure Laterality Date  . Cholecystectomy    . Tonsillectomy and adenoidectomy    . Abdominal hysterectomy    . Oophorectomy    .  Breast reduction surgery    . Tummy tuck    . Bladder suspension    . Discectomy l5 s1      required fusion 16 days after surgery  . Spinal fusion  10/2011    L5.S1  . Lithotripsy  03/21/12    7 procedures     Family History  Problem Relation Age of Onset  . Diabetes Mother   . Hyperlipidemia Mother   . Cancer Mother 18    bio duct cancer/liver/2008  . Diabetes Father   . Hyperlipidemia Father   . Heart disease Father     died heart attack; 03/21/05  . Cancer Sister     melanoma, another sister with colon CA at 26  . Cancer Maternal Aunt     colon CA  . Cancer Maternal Uncle     colon CA  . Cancer Maternal Grandmother     History   Social History  . Marital Status: Divorced    Spouse Name: N/A    Number of Children: N/A  . Years of Education: N/A   Occupational History  . Not on file.   Social History Main Topics  . Smoking status: Never Smoker   . Smokeless tobacco: Never Used  . Alcohol Use: Yes  .  Drug Use: No  . Sexual Activity: Yes    Partners: Male   Other Topics Concern  . Not on file   Social History Narrative   Divorced, three children- 28, 58, and 2 yo.   Works at [employer] in [place].   Regular exercise: some   Caffeine use: coke         The PMH, PSH, Social History, Family History, Medications, and allergies have been reviewed in [facility], and have been updated if relevant.   OBJECTIVE: BP 116/68  Pulse 72  Temp(Src) 98.2 F (36.8 C) (Oral)  Wt 145 lb 12 oz (66.112 kg)  SpO2 97%  Vital signs as noted above. Patient appears to be in mild to moderate pain.  Neck exam: normal C-spine, no tenderness, full ROM without pain, tenderness over trapezial muscles, negative Lhermitte's sign.  Neg arch sign bilaterally Normal grip strength bilaterally  X-Ray: ordered, but results not yet available.

## 2014-06-17 ENCOUNTER — Telehealth: Payer: Self-pay | Admitting: Family Medicine

## 2014-06-17 NOTE — Assessment & Plan Note (Signed)
Based her exam and history, ? If multifactorial. She does have trapezius spasm.  ? DJD of spine as well. No indication of rotator cuff tendonopathy or tear.  Will get xray of cervical spine, refer to PT. The patient indicates understanding of these issues and agrees with the plan.

## 2014-06-17 NOTE — Telephone Encounter (Signed)
Patient returned your call.  You can call her back at work or on her cell phone.

## 2014-06-17 NOTE — Telephone Encounter (Signed)
Spoke to pt and informed her of results.  

## 2014-06-22 ENCOUNTER — Encounter: Payer: Self-pay | Admitting: Family Medicine

## 2014-06-23 ENCOUNTER — Encounter: Payer: Self-pay | Admitting: Internal Medicine

## 2014-06-25 ENCOUNTER — Ambulatory Visit (INDEPENDENT_AMBULATORY_CARE_PROVIDER_SITE_OTHER): Payer: BC Managed Care – PPO | Admitting: *Deleted

## 2014-06-25 ENCOUNTER — Ambulatory Visit: Payer: BC Managed Care – PPO

## 2014-06-25 DIAGNOSIS — E538 Deficiency of other specified B group vitamins: Secondary | ICD-10-CM

## 2014-06-25 MED ORDER — CYANOCOBALAMIN 1000 MCG/ML IJ SOLN
1000.0000 ug | Freq: Once | INTRAMUSCULAR | Status: AC
  Administered 2014-06-25: 1000 ug via INTRAMUSCULAR

## 2014-07-08 ENCOUNTER — Ambulatory Visit: Payer: BC Managed Care – PPO | Admitting: Internal Medicine

## 2014-07-10 ENCOUNTER — Encounter: Payer: Self-pay | Admitting: Family Medicine

## 2014-07-28 ENCOUNTER — Ambulatory Visit: Payer: BC Managed Care – PPO

## 2014-07-29 ENCOUNTER — Ambulatory Visit (INDEPENDENT_AMBULATORY_CARE_PROVIDER_SITE_OTHER): Payer: BC Managed Care – PPO

## 2014-07-29 DIAGNOSIS — E538 Deficiency of other specified B group vitamins: Secondary | ICD-10-CM

## 2014-07-29 MED ORDER — CYANOCOBALAMIN 1000 MCG/ML IJ SOLN
1000.0000 ug | Freq: Once | INTRAMUSCULAR | Status: AC
  Administered 2014-07-29: 1000 ug via INTRAMUSCULAR

## 2014-08-03 ENCOUNTER — Telehealth: Payer: Self-pay | Admitting: Internal Medicine

## 2014-08-03 MED ORDER — LIRAGLUTIDE 18 MG/3ML ~~LOC~~ SOPN: 1.8000 mg | PEN_INJECTOR | Freq: Every day | SUBCUTANEOUS | Status: DC

## 2014-08-03 NOTE — Telephone Encounter (Signed)
Refill done.  

## 2014-08-03 NOTE — Telephone Encounter (Signed)
Patient need refill of Victoza, [facility] in [place]

## 2014-08-04 ENCOUNTER — Other Ambulatory Visit: Payer: Self-pay | Admitting: *Deleted

## 2014-08-04 NOTE — Telephone Encounter (Signed)
Pt called and lvm asking about refill for her Victoza. Called pt back and lvm advising her that Dr Cruzita Lederer refilled this rx on 08/03/14 at 11:52 am, according to [facility]. Advised pt to contact [place] again.

## 2014-08-10 ENCOUNTER — Encounter: Payer: Self-pay | Admitting: Family Medicine

## 2014-08-18 ENCOUNTER — Ambulatory Visit: Payer: BC Managed Care – PPO | Admitting: Internal Medicine

## 2014-09-03 ENCOUNTER — Ambulatory Visit (INDEPENDENT_AMBULATORY_CARE_PROVIDER_SITE_OTHER): Payer: BC Managed Care – PPO | Admitting: Family Medicine

## 2014-09-03 DIAGNOSIS — E538 Deficiency of other specified B group vitamins: Secondary | ICD-10-CM

## 2014-09-03 DIAGNOSIS — Z23 Encounter for immunization: Secondary | ICD-10-CM

## 2014-09-03 MED ORDER — CYANOCOBALAMIN 1000 MCG/ML IJ SOLN
1000.0000 ug | Freq: Once | INTRAMUSCULAR | Status: AC
  Administered 2014-09-03: 1000 ug via INTRAMUSCULAR

## 2014-09-09 ENCOUNTER — Telehealth: Payer: Self-pay | Admitting: Family Medicine

## 2014-09-09 HISTORY — PX: CARDIAC CATHETERIZATION: SHX172

## 2014-09-09 NOTE — Telephone Encounter (Signed)
Dr. Armanda Magic; Below is the [employer] sent the front office staff. I have scheduled her for Monday Oct 5 @ 8:30.    I have a couple things I would like to review with you. Last Friday/Saturday I had extreme chest pains with vomiting and faintness. I worked through it but a couple of times I almost went to the emergency. I actually went over to [facility] as I was working on Saturday but it was like a 3+ hour wait. Can we look at this. About 12 years ago I had a mild MTI.   Now I have a severe sore throat and some mild nasal drippings. I am thinking sinus but work people are nervous to work with me. Hard to swallow. No fever as of yet. I did receive my flu shot in your office this past Friday.

## 2014-09-13 ENCOUNTER — Ambulatory Visit (INDEPENDENT_AMBULATORY_CARE_PROVIDER_SITE_OTHER): Payer: BC Managed Care – PPO | Admitting: Family Medicine

## 2014-09-13 ENCOUNTER — Encounter: Payer: Self-pay | Admitting: Internal Medicine

## 2014-09-13 ENCOUNTER — Encounter: Payer: Self-pay | Admitting: Family Medicine

## 2014-09-13 ENCOUNTER — Ambulatory Visit (INDEPENDENT_AMBULATORY_CARE_PROVIDER_SITE_OTHER): Payer: BC Managed Care – PPO | Admitting: Internal Medicine

## 2014-09-13 VITALS — BP 106/68 | HR 97 | Temp 97.8°F | Resp 12 | Wt 148.8 lb

## 2014-09-13 VITALS — BP 120/84 | HR 79 | Temp 97.8°F | Wt 149.5 lb

## 2014-09-13 DIAGNOSIS — R9431 Abnormal electrocardiogram [ECG] [EKG]: Secondary | ICD-10-CM

## 2014-09-13 DIAGNOSIS — IMO0002 Reserved for concepts with insufficient information to code with codable children: Secondary | ICD-10-CM

## 2014-09-13 DIAGNOSIS — R197 Diarrhea, unspecified: Secondary | ICD-10-CM | POA: Insufficient documentation

## 2014-09-13 DIAGNOSIS — R0789 Other chest pain: Secondary | ICD-10-CM

## 2014-09-13 DIAGNOSIS — E785 Hyperlipidemia, unspecified: Secondary | ICD-10-CM

## 2014-09-13 DIAGNOSIS — E1165 Type 2 diabetes mellitus with hyperglycemia: Secondary | ICD-10-CM

## 2014-09-13 DIAGNOSIS — E1140 Type 2 diabetes mellitus with diabetic neuropathy, unspecified: Secondary | ICD-10-CM

## 2014-09-13 DIAGNOSIS — R079 Chest pain, unspecified: Secondary | ICD-10-CM

## 2014-09-13 LAB — HEMOGLOBIN A1C: Hgb A1c MFr Bld: 7.1 % — ABNORMAL HIGH (ref 4.6–6.5)

## 2014-09-13 MED ORDER — ROSUVASTATIN CALCIUM 40 MG PO TABS: 40.0000 mg | ORAL_TABLET | Freq: Every day | ORAL | Status: DC

## 2014-09-13 MED ORDER — LIRAGLUTIDE 18 MG/3ML ~~LOC~~ SOPN: 1.8000 mg | PEN_INJECTOR | Freq: Every day | SUBCUTANEOUS | Status: DC

## 2014-09-13 NOTE — Patient Instructions (Addendum)
Great to see you. Please add an aspirin 81 mg daily.  Please stop by to see Rosaria Ferries on your way out to set up your cardiology appointment.  IF you should develop any further symptoms, call me AND/OR go to the ER.  If abdominal pain returns, please call me.

## 2014-09-13 NOTE — Assessment & Plan Note (Signed)
New- seems to be resolving. ?gastroenteritis Stay hydrated.  If abdominal pain returns, she will let me know- we will order CT of abdomen and pelvis to rule out appendicitis, diverticulitis.

## 2014-09-13 NOTE — Progress Notes (Signed)
Subjective:   Patient ID: Patricia Golden, female    DOB: 06-10-1961, 53 y.o.   MRN: [id number]  Patricia Golden is a pleasant 53 y.o. year old female who presents to clinic today with Chest Pain, Neck Pain and Emesis  on 09/13/2014  HPI:  Chest pain-  Episode occurred over 1 week ago. Started suddenly at work.  Felt like GERD- epigastric burning and pressure. Later that night, pressure worsened and became associated with nausea, vomiting and diaphoresis and did radiate to her left neck. Did go to the ER the following day since she was still having symptoms but wait was too long and left.  After about 48 hours, symptoms resolved completely. Did not take anything for it. She did not notice if it was worsened by exertion or relieved by stress.  See mychart message sent to front office:  H/o HLD, DM, Strong FH of CAD and she herself as been told she had a small MI 12 years ago.  She is taking Crestor.  History of poorly controlled DM very high, uncontrolled HLD. Diabetes now under good control.  Followed by Dr. Cruzita Lederer. Last saw her on 04/07/14- note reviewed.  Lab Results  Component Value Date   HGBA1C 6.4 03/29/2014   Lab Results  Component Value Date   CHOL 235* 12/22/2013   HDL 44.80 12/22/2013   LDLCALC 49 07/24/2012   LDLDIRECT 151.3 12/22/2013   TRIG 301.0* 12/22/2013   CHOLHDL 5 12/22/2013     Does not take betablock or ASA.  Current Outpatient Prescriptions on File Prior to Visit  Medication Sig Dispense Refill  . albuterol (PROVENTIL HFA;VENTOLIN HFA) 108 (90 BASE) MCG/ACT inhaler Inhale 2 puffs into the lungs every 6 (six) hours as needed for wheezing.  1 Inhaler  3  . Fluticasone-Salmeterol (ADVAIR DISKUS) 500-50 MCG/DOSE AEPB Inhale 1 puff into the lungs daily.  60 each  3  . glucose blood (ONE TOUCH ULTRA TEST) test strip Use to test blood sugar 6 times daily as instructed  200 each  10  . Liraglutide 18 MG/3ML SOPN Inject 1.8 mg into the skin daily before  breakfast.  3 pen  1  . metFORMIN (GLUCOPHAGE) 1000 MG tablet 1,000 mg 2 (two) times daily with a meal. Take 2 by mouth twice daily      . ONETOUCH DELICA LANCETS 41D MISC Use to test blood sugar 6 times daily as instructed.  200 each  prn  . RELION MINI PEN NEEDLES 31G X 6 MM MISC ONE  ONCE DAILY AS DIRECTED  100 each  0  . rosuvastatin (CRESTOR) 20 MG tablet Take 1 tablet (20 mg total) by mouth daily.  30 tablet  3   No current facility-administered medications on file prior to visit.    Allergies  Allergen Reactions  . Codeine Nausea And Vomiting    Past Medical History  Diagnosis Date  . SIRS (systemic inflammatory response syndrome) 06/2012    secondary to pyleonephritis  . Staghorn calculus 06/2012    Dr. Yves Dill is urologist  . DM (diabetes mellitus)   . Hyperlipidemia   . Asthma     Past Surgical History  Procedure Laterality Date  . Cholecystectomy    . Tonsillectomy and adenoidectomy    . Abdominal hysterectomy    . Oophorectomy    . Breast reduction surgery    . Tummy tuck    . Bladder suspension    . Discectomy l5 s1      required  fusion 16 days after surgery  . Spinal fusion  10/2011    L5.S1  . Lithotripsy  2012/03/13    7 procedures     Family History  Problem Relation Age of Onset  . Diabetes Mother   . Hyperlipidemia Mother   . Cancer Mother 25    bio duct cancer/liver/2008  . Diabetes Father   . Hyperlipidemia Father   . Heart disease Father     died heart attack; 03/13/2005  . Cancer Sister     melanoma, another sister with colon CA at 55  . Cancer Maternal Aunt     colon CA  . Cancer Maternal Uncle     colon CA  . Cancer Maternal Grandmother     History   Social History  . Marital Status: Divorced    Spouse Name: N/A    Number of Children: N/A  . Years of Education: N/A   Occupational History  . Not on file.   Social History Main Topics  . Smoking status: Never Smoker   . Smokeless tobacco: Never Used  . Alcohol Use: Yes  . Drug Use: No    . Sexual Activity: Yes    Partners: Male   Other Topics Concern  . Not on file   Social History Narrative   Divorced, three children- 27, 27, and 57 yo.   Works at [employer] in [place].   Regular exercise: some   Caffeine use: coke         The PMH, PSH, Social History, Family History, Medications, and allergies have been reviewed in [facility], and have been updated if relevant.  Review of Systems See HPI No further CP No neck pain now No fever Did have diarrhea yesterday and some abdominal pain which is improving No SOB No syncope No further vomiting No blood in stool    Objective:    BP 120/84  Pulse 79  Temp(Src) 97.8 F (36.6 C) (Oral)  Wt 149 lb 8 oz (67.813 kg)  SpO2 96%   Physical Exam  Nursing note and vitals reviewed. Constitutional: She appears well-developed and well-nourished. No distress.  HENT:  Head: Normocephalic.  Cardiovascular: Normal rate and regular rhythm.   Pulmonary/Chest: Effort normal and breath sounds normal. No respiratory distress.  Abdominal: Soft. Bowel sounds are normal. She exhibits no mass. There is tenderness. There is no rebound and no guarding.  Skin: Skin is warm and dry.          Assessment & Plan:   Other chest pain - Plan: EKG 12-Lead No Follow-up on file.

## 2014-09-13 NOTE — Assessment & Plan Note (Signed)
New- with her h/o very elevated lipids, DM and ?CAD along with strong FH of CAD, this is very concerning for cardiac etiology. EKG abnormal with some flip T waves- cannot rule out anteroseptal ischemia. Add ASA 81 mg, refer immediately to cardiology. Rx not given for NTG as she has not had any more pain in over a week.

## 2014-09-13 NOTE — Progress Notes (Signed)
Pre visit review using our clinic review tool, if applicable. No additional management support is needed unless otherwise documented below in the visit note. 

## 2014-09-13 NOTE — Progress Notes (Addendum)
Patient ID: Patricia Golden, female   DOB: May 22, 1961, 53 y.o.   MRN: [id number]  HPI: Patricia Golden is a 53 y.o.-year-old female, returning for f/u for DM2, dx 2007, non-insulin-dependent, uncontrolled, with complications (peripheral neuropathy). Last visit 5 mo ago.  She had CP 1 week ago (+ N/V) >> will see cardiology (Dr Fletcher Anon) this week.   Last hemoglobin A1c was: Lab Results  Component Value Date   HGBA1C 6.4 03/29/2014   HGBA1C 6.9* 12/22/2013   HGBA1C 11.7* 09/14/2013   Pt is on a regimen of: - Metformin 1000 mg po bid - Victoza 1.8 mg daily She was on Actos >> generalized pain and weight gain. She was on Glipizide XL 2.5 mg >> stopped b/c lows.  Pt checks her sugars 1x a day now (no log) - am: 100-300 or higher >> 108-181 >> 110-125 (highest 130) >> 90-110 >> 90s, 110-120 - 2h after b'fast: 126-178 >> 130-140 >> n/c - before lunch: 100-300s >> 120-154 >> 130s >> 120s >> n/c - 2h after lunch: 134-170 >>130-140 >> n/c >> 120-130, 154 - before dinner: 100-300s >> 154-176 >> up to 140 >> n/c - bedtime: 200-300s >> 136-161 >>  115-120 >> up to 155 >> n/c Rarely lows. Lowest sugar was 90; she has hypoglycemia awareness at 70-75. Highest sugar was high 155.  Sh changed her diet and also now exercises >> lost weight - stable since last visit. She wants to start a diet Program in [place]- will send me the link. She lost ~25 lbs after starting Victoza >> size 14 >> 8.   - no CKD, last BUN/creatinine:  Lab Results  Component Value Date   BUN 13 12/22/2013   CREATININE 0.7 12/22/2013  last ACR 3.4 on 09/14/2013. Not on ACE inhibitors. - Has a very high LDL, last set of lipids: Lab Results  Component Value Date   CHOL 235* 12/22/2013   HDL 44.80 12/22/2013   LDLCALC 49 07/24/2012   LDLDIRECT 151.3 12/22/2013   TRIG 301.0* 12/22/2013   CHOLHDL 5 12/22/2013  She restarted Crestor.  - last eye exam was in 05/2014. No DR. Has eye exam yearly. - no numbness and tingling in her feet. Foot  exam normal as performed by PCP on 09/14/2013.   She had a complicated kidney stone  In 11/2013 >> stent placed >> infection >> stone retrieval.  ROS: Constitutional: + weight gain, no fatigue Eyes: no blurry vision, no xerophthalmia ENT: + sore throat, no nodules palpated in throat, no dysphagia/odynophagia, + hoarseness Cardiovascular: no CP (had CP 1 week ago) /SOB/palpitations/leg swelling Respiratory: no cough/SOB Gastrointestinal: no N/V/D/C Musculoskeletal: no  muscle aches/joint aches Skin: no rashes Neurological: no tremors/numbness/tingling/dizziness  I reviewed pt's medications, allergies, PMH, social hx, family hx and no changes required, except as mentioned above.  PE: BP 106/68  Pulse 97  Temp(Src) 97.8 F (36.6 C) (Oral)  Resp 12  Wt 148 lb 12.8 oz (67.495 kg)  SpO2 98% Body mass index is 29.56 kg/(m^2).  Wt Readings from Last 3 Encounters:  09/13/14 148 lb 12.8 oz (67.495 kg)  09/13/14 149 lb 8 oz (67.813 kg)  06/16/14 145 lb 12 oz (66.112 kg)   Constitutional: overweight, in NAD Eyes: PERRLA, EOMI, no exophthalmos ENT: moist mucous membranes, no thyromegaly, no cervical lymphadenopathy Cardiovascular: RRR, No MRG Respiratory: CTA B Gastrointestinal: abdomen soft, NT, ND, BS+ Musculoskeletal: no deformities, strength intact in all 4 Skin: moist, warm, no rashes Neurological: no tremor with outstretched hands, DTR  normal in all 4  ASSESSMENT: 1. DM2, non-insulin-dependent, uncontrolled, with complications - PN  PLAN:  1. Patient with long-standing, uncontrolled diabetes, with impressive recent improvement in weight and diabetes control! - I suggested to:  Patient Instructions  Keep up the great work! Continue Victoza 1.8 mg daily Continue Metformin 1000 mg 2x daily. - she is up to date with yearly eye exams - will check a new A1c today - Return to clinic in 3 mo with sugar log   Office Visit on 09/13/2014  Component Date Value Ref Range Status   . Hemoglobin A1C 09/13/2014 7.1* 4.6 - 6.5 % Final   Glycemic Control Guidelines for People with Diabetes:Non Diabetic:  <6%Goal of Therapy: <7%Additional Action Suggested:  >8%    The HbA1c is a little higher.

## 2014-09-13 NOTE — Addendum Note (Signed)
Addended by: Philemon Kingdom on: 09/13/2014 02:59 PM   Modules accepted: Orders

## 2014-09-13 NOTE — Assessment & Plan Note (Addendum)
Not at goal and even more concerning with her recent event. Increase crestor dose to 40 mg daily. Follow up with cardiology and recheck labs here in 4-8 if not being done with cardiology. The patient indicates understanding of these issues and agrees with the plan.

## 2014-09-13 NOTE — Patient Instructions (Signed)
Continue Victoza 1.8 mg daily Continue Metformin 1000 mg 2x daily.  Please return in 3 months with your sugar log.

## 2014-09-17 ENCOUNTER — Telehealth: Payer: Self-pay | Admitting: *Deleted

## 2014-09-17 ENCOUNTER — Other Ambulatory Visit: Payer: Self-pay

## 2014-09-17 ENCOUNTER — Ambulatory Visit: Payer: Self-pay | Admitting: Cardiovascular Disease

## 2014-09-17 ENCOUNTER — Encounter: Payer: Self-pay | Admitting: Cardiovascular Disease

## 2014-09-17 ENCOUNTER — Ambulatory Visit (INDEPENDENT_AMBULATORY_CARE_PROVIDER_SITE_OTHER): Payer: BC Managed Care – PPO | Admitting: Cardiovascular Disease

## 2014-09-17 VITALS — BP 119/82 | HR 66 | Ht 60.0 in | Wt 146.8 lb

## 2014-09-17 DIAGNOSIS — R55 Syncope and collapse: Secondary | ICD-10-CM

## 2014-09-17 DIAGNOSIS — R002 Palpitations: Secondary | ICD-10-CM

## 2014-09-17 DIAGNOSIS — R079 Chest pain, unspecified: Secondary | ICD-10-CM

## 2014-09-17 DIAGNOSIS — E785 Hyperlipidemia, unspecified: Secondary | ICD-10-CM

## 2014-09-17 LAB — CBC WITH DIFFERENTIAL/PLATELET
BASOS ABS: 0.1 10*3/uL (ref 0.0–0.1)
Basophil %: 1 %
EOS PCT: 4.4 %
Eosinophil #: 0.4 10*3/uL (ref 0.0–0.7)
HCT: 41.2 % (ref 35.0–47.0)
HGB: 13.1 g/dL (ref 12.0–16.0)
Lymphocyte #: 3.9 10*3/uL — ABNORMAL HIGH (ref 1.0–3.6)
Lymphocyte %: 41.7 %
MCH: 26.1 pg (ref 26.0–34.0)
MCHC: 31.7 g/dL — ABNORMAL LOW (ref 32.0–36.0)
MCV: 82 fL (ref 80–100)
MONOS PCT: 4.4 %
Monocyte #: 0.4 x10 3/mm (ref 0.2–0.9)
NEUTROS ABS: 4.5 10*3/uL (ref 1.4–6.5)
NEUTROS PCT: 48.5 %
Platelet: 329 10*3/uL (ref 150–440)
RBC: 5.01 10*6/uL (ref 3.80–5.20)
RDW: 13.8 % (ref 11.5–14.5)
WBC: 9.3 10*3/uL (ref 3.6–11.0)

## 2014-09-17 LAB — BASIC METABOLIC PANEL
Anion Gap: 7 (ref 7–16)
BUN: 16 mg/dL (ref 7–18)
CALCIUM: 9.1 mg/dL (ref 8.5–10.1)
CHLORIDE: 103 mmol/L (ref 98–107)
Co2: 29 mmol/L (ref 21–32)
Creatinine: 0.93 mg/dL (ref 0.60–1.30)
EGFR (African American): >60
EGFR (Non-African Amer.): >60
GLUCOSE: 145 mg/dL — AB (ref 65–99)
Osmolality: 281 (ref 275–301)
Potassium: 3.8 mmol/L (ref 3.5–5.1)
Sodium: 139 mmol/L (ref 136–145)

## 2014-09-17 LAB — PROTIME-INR
INR: 0.9
PROTHROMBIN TIME: 11.8 s (ref 11.5–14.7)

## 2014-09-17 MED ORDER — ASPIRIN EC 81 MG PO TBEC: 81.0000 mg | DELAYED_RELEASE_TABLET | Freq: Every day | ORAL | Status: AC

## 2014-09-17 NOTE — Assessment & Plan Note (Signed)
Lab Results  Component Value Date   CHOL 235* 12/22/2013   HDL 44.80 12/22/2013   LDLCALC 49 07/24/2012   LDLDIRECT 151.3 12/22/2013   TRIG 301.0* 12/22/2013   CHOLHDL 5 12/22/2013   Continue treatment with rosuvastatin which was recently increased to 40 mg.

## 2014-09-17 NOTE — Progress Notes (Signed)
Primary care physician: Dr. Deborra Medina HPI  This is a 53 year old female who was referred for evaluation of chest pain. She has no previous cardiac history. She has prolonged history of type 2 diabetes which has been mostly uncontrolled up until recently, hyperlipidemia and strong family history of coronary artery disease. She reports having an abnormal EKG about 12 years ago. She was referred to a cardiologist and had a stress test done. She was told about having a small myocardial infarction but she is not aware of previous cardiac catheterization or revascularization. She had a recent episode of substernal and epigastric burning sensation radiating to her back. This was associated with shortness of breath, dizziness and vomiting. She had few minor episodes since that time. She feels more tired, fatigued and out of breath as the episode.  She is not a smoker. She works at [employer].   Allergies  Allergen Reactions  . Codeine Nausea And Vomiting  . Hydromorphone     Other reaction(s): Other (See Comments) Caused chest pain.     Current Outpatient Prescriptions on File Prior to Visit  Medication Sig Dispense Refill  . albuterol (PROVENTIL HFA;VENTOLIN HFA) 108 (90 BASE) MCG/ACT inhaler Inhale 2 puffs into the lungs every 6 (six) hours as needed for wheezing.  1 Inhaler  3  . glucose blood (ONE TOUCH ULTRA TEST) test strip Use to test blood sugar 6 times daily as instructed  200 each  10  . Liraglutide 18 MG/3ML SOPN Inject 1.8 mg into the skin daily before breakfast.  9 pen  3  . metFORMIN (GLUCOPHAGE) 1000 MG tablet 1,000 mg 2 (two) times daily with a meal. Take 2 by mouth twice daily      . ONETOUCH DELICA LANCETS 16X MISC Use to test blood sugar 6 times daily as instructed.  200 each  prn  . RELION MINI PEN NEEDLES 31G X 6 MM MISC ONE  ONCE DAILY AS DIRECTED  100 each  0  . rosuvastatin (CRESTOR) 40 MG tablet Take 1 tablet (40 mg total) by mouth daily.  30 tablet  3   No current  facility-administered medications on file prior to visit.     Past Medical History  Diagnosis Date  . SIRS (systemic inflammatory response syndrome) 06/2012    secondary to pyleonephritis  . Staghorn calculus 06/2012    Dr. Yves Dill is urologist  . DM (diabetes mellitus)   . Hyperlipidemia   . Asthma      Past Surgical History  Procedure Laterality Date  . Cholecystectomy    . Tonsillectomy and adenoidectomy    . Abdominal hysterectomy    . Oophorectomy    . Breast reduction surgery    . Tummy tuck    . Bladder suspension    . Discectomy l5 s1      required fusion 16 days after surgery  . Spinal fusion  10/2011    L5.S1  . Lithotripsy  03-30-2012    7 procedures      Family History  Problem Relation Age of Onset  . Diabetes Mother   . Hyperlipidemia Mother   . Cancer Mother 64    bio duct cancer/liver/2008  . Diabetes Father   . Hyperlipidemia Father   . Heart disease Father     died heart attack; 2005-03-30  . Heart attack Father   . Cancer Sister     melanoma, another sister with colon CA at 87  . Cancer Maternal Aunt     colon  CA  . Cancer Maternal Uncle     colon CA  . Cancer Maternal Grandmother      History   Social History  . Marital Status: Divorced    Spouse Name: N/A    Number of Children: N/A  . Years of Education: N/A   Occupational History  . Not on file.   Social History Main Topics  . Smoking status: Never Smoker   . Smokeless tobacco: Never Used  . Alcohol Use: Yes     Comment: occasional  . Drug Use: No  . Sexual Activity: Yes    Partners: Male   Other Topics Concern  . Not on file   Social History Narrative   Divorced, three children- 50, 44, and 29 yo.   Works at [employer] in [place].   Regular exercise: some   Caffeine use: coke           ROS A 10 point review of system was performed. It is negative other than that mentioned in the history of present illness.   PHYSICAL EXAM   BP 119/82  Pulse 66  Ht 5' (1.524 m)   Wt 146 lb 12 oz (66.565 kg)  BMI 28.66 kg/m2 Constitutional: She is oriented to person, place, and time. She appears well-developed and well-nourished. No distress.  HENT: No nasal discharge.  Head: Normocephalic and atraumatic.  Eyes: Pupils are equal and round. No discharge.  Neck: Normal range of motion. Neck supple. No JVD present. No thyromegaly present.  Cardiovascular: Normal rate, regular rhythm, normal heart sounds. Exam reveals no gallop and no friction rub. No murmur heard.  Pulmonary/Chest: Effort normal and breath sounds normal. No stridor. No respiratory distress. She has no wheezes. She has no rales. She exhibits no tenderness.  Abdominal: Soft. Bowel sounds are normal. She exhibits no distension. There is no tenderness. There is no rebound and no guarding.  Musculoskeletal: Normal range of motion. She exhibits no edema and no tenderness.  Neurological: She is alert and oriented to person, place, and time. Coordination normal.  Skin: Skin is warm and dry. No rash noted. She is not diaphoretic. No erythema. No pallor.  Psychiatric: She has a normal mood and affect. Her behavior is normal. Judgment and thought content normal.     MOL:MBEML  Rhythm  Low voltage in precordial leads.   -Anteroseptal infarct -age undetermined  -Old inferior infarct.   ABNORMAL     ASSESSMENT AND PLAN

## 2014-09-17 NOTE — Assessment & Plan Note (Signed)
Although some of the patient's symptoms are atypical for angina. It is highly possible that she has underlying obstructive coronary artery disease with atypical presentation given her prolonged history of type 2 diabetes. I am mostly concerned about her significantly abnormal EKG with deeply inverted anterior T waves suggestive of anterior ischemia.  I discussed different management options including proceeding with stress testing versus directly with cardiac catheterization. Given that significant abnormality on EKG and her risk factors, I favor the second option. I discussed risks and benefits. The procedure would be scheduled for next week. In the meanwhile, I asked her again to stop aspirin 81 mg once daily and to avoid significant physical exertion and that cardiac workup is completed.

## 2014-09-17 NOTE — Patient Instructions (Addendum)
[facility] Cardiac Cath Instructions   You are scheduled for a Cardiac Cath on:__10/12/15_______________________  Please arrive at 1130_______am on the day of your procedure  You will need to pre-register prior to the day of your procedure.  Enter through the [employer] at [facility].  Registration is the first desk on your right.  Please take the procedure order we have given you in order to be registered appropriately  Do not eat/drink anything after midnight  Someone will need to drive you home  It is recommended someone be with you for the first 24 hours after your procedure  Wear clothes that are easy to get on/off and wear slip on shoes if possible   Medications bring a current list of all medications with you   _x__ Do not take these medications before your procedure:  Do not take Metformin one day before or two days after. Do not take any diabetes medication the morning of your procedure. Take the rest of your medications with a sip of water.   Day of your procedure: Arrive at the [facility] entrance.  Free valet service is available.  After entering the [place] please check-in at the registration desk (1st desk on your right) to receive your armband. After receiving your armband someone will escort you to the cardiac cath/special procedures waiting area.  The usual length of stay after your procedure is about 2 to 3 hours.  This can vary.  If you have any questions, please call our office at [phone], or you may call the cardiac cath lab at [facility] directly at [phone]  Your physician recommends that you take your lab and chest x ray orders to [facility] today to be completed.   Your physician has recommended you make the following change in your medication:  Take Aspirin 81 mg once daily

## 2014-09-17 NOTE — Telephone Encounter (Signed)
Cath orders faxed to specials

## 2014-09-20 ENCOUNTER — Ambulatory Visit: Payer: Self-pay | Admitting: Cardiovascular Disease

## 2014-09-20 DIAGNOSIS — I200 Unstable angina: Secondary | ICD-10-CM

## 2014-09-28 ENCOUNTER — Encounter: Payer: Self-pay | Admitting: Cardiovascular Disease

## 2014-10-04 ENCOUNTER — Encounter: Payer: Self-pay | Admitting: Cardiovascular Disease

## 2014-10-04 ENCOUNTER — Ambulatory Visit (INDEPENDENT_AMBULATORY_CARE_PROVIDER_SITE_OTHER): Payer: BC Managed Care – PPO | Admitting: Cardiovascular Disease

## 2014-10-04 VITALS — BP 119/79 | HR 61 | Ht 60.0 in | Wt 150.2 lb

## 2014-10-04 DIAGNOSIS — E785 Hyperlipidemia, unspecified: Secondary | ICD-10-CM

## 2014-10-04 DIAGNOSIS — R079 Chest pain, unspecified: Secondary | ICD-10-CM

## 2014-10-04 NOTE — Assessment & Plan Note (Signed)
Lab Results  Component Value Date   CHOL 235* 12/22/2013   HDL 44.80 12/22/2013   LDLCALC 49 07/24/2012   LDLDIRECT 151.3 12/22/2013   TRIG 301.0* 12/22/2013   CHOLHDL 5 12/22/2013   Continue treatment with rosuvastatin which was recently increased to 40 mg. recommend a target LDL of less than 100.

## 2014-10-04 NOTE — Assessment & Plan Note (Signed)
Cardiac catheterization showed normal coronary arteries with normal ejection fraction. Some of her symptoms have anginal features with abnormal ECG. Thus, endothelial dysfunction cannot be completely excluded. Nonetheless, symptoms seem to correlate mostly with stress. She reports improvement in symptoms without intervention. Continue treatment of risk factors and followup with me as needed.

## 2014-10-04 NOTE — Patient Instructions (Signed)
Follow up as needed

## 2014-10-04 NOTE — Progress Notes (Signed)
Primary care physician: Dr. Deborra Medina HPI  This is a 53 year old female who is here today for a followup visit regarding  chest pain and abnormal EKG. She has no previous cardiac history. She has prolonged history of type 2 diabetes which has been mostly uncontrolled up until recently, hyperlipidemia and strong family history of coronary artery disease. She reports having an abnormal EKG about 12 years ago. She was referred to a cardiologist and had a stress test done.  She was seen recently for substernal and epigastric burning sensation radiating to her back. This was associated with shortness of breath, dizziness and vomiting. EKG was abnormal with anterior T-wave inversion. Due to that, I proceeded with cardiac catheterization which showed normal coronary arteries with normal ejection fraction. She is overall doing better. She gets chest pain when she is under stress. She has been under stress at work.  Allergies  Allergen Reactions  . Codeine Nausea And Vomiting  . Hydromorphone     Other reaction(s): Other (See Comments) Caused chest pain.     Current Outpatient Prescriptions on File Prior to Visit  Medication Sig Dispense Refill  . albuterol (PROVENTIL HFA;VENTOLIN HFA) 108 (90 BASE) MCG/ACT inhaler Inhale 2 puffs into the lungs every 6 (six) hours as needed for wheezing.  1 Inhaler  3  . aspirin EC 81 MG tablet Take 1 tablet (81 mg total) by mouth daily.      . Fluticasone-Salmeterol (ADVAIR) 500-50 MCG/DOSE AEPB Inhale 1 puff into the lungs as needed.      Marland Kitchen glucose blood (ONE TOUCH ULTRA TEST) test strip Use to test blood sugar 6 times daily as instructed  200 each  10  . Liraglutide 18 MG/3ML SOPN Inject 1.8 mg into the skin daily before breakfast.  9 pen  3  . metFORMIN (GLUCOPHAGE) 1000 MG tablet 1,000 mg 2 (two) times daily with a meal. Take 2 by mouth twice daily      . ONETOUCH DELICA LANCETS 17C MISC Use to test blood sugar 6 times daily as instructed.  200 each  prn  . RELION  MINI PEN NEEDLES 31G X 6 MM MISC ONE  ONCE DAILY AS DIRECTED  100 each  0  . rosuvastatin (CRESTOR) 40 MG tablet Take 1 tablet (40 mg total) by mouth daily.  30 tablet  3   No current facility-administered medications on file prior to visit.     Past Medical History  Diagnosis Date  . SIRS (systemic inflammatory response syndrome) 06/2012    secondary to pyleonephritis  . Staghorn calculus 06/2012    Dr. Yves Dill is urologist  . DM (diabetes mellitus)   . Hyperlipidemia   . Asthma      Past Surgical History  Procedure Laterality Date  . Cholecystectomy    . Tonsillectomy and adenoidectomy    . Abdominal hysterectomy    . Oophorectomy    . Breast reduction surgery    . Tummy tuck    . Bladder suspension    . Discectomy l5 s1      required fusion 16 days after surgery  . Spinal fusion  10/2011    L5.S1  . Lithotripsy  2013    7 procedures   . Cardiac catheterization  09/2014    [facility]     Family History  Problem Relation Age of Onset  . Diabetes Mother   . Hyperlipidemia Mother   . Cancer Mother 56    bio duct cancer/liver/2008  . Diabetes Father   .  Hyperlipidemia Father   . Heart disease Father     died heart attack; 04/01/05  . Heart attack Father   . Cancer Sister     melanoma, another sister with colon CA at 84  . Cancer Maternal Aunt     colon CA  . Cancer Maternal Uncle     colon CA  . Cancer Maternal Grandmother      History   Social History  . Marital Status: Divorced    Spouse Name: N/A    Number of Children: N/A  . Years of Education: N/A   Occupational History  . Not on file.   Social History Main Topics  . Smoking status: Never Smoker   . Smokeless tobacco: Never Used  . Alcohol Use: Yes     Comment: occasional  . Drug Use: No  . Sexual Activity: Yes    Partners: Male   Other Topics Concern  . Not on file   Social History Narrative   Divorced, three children- 37, 59, and 66 yo.   Works at [employer] in [place].   Regular exercise:  some   Caffeine use: coke           ROS A 10 point review of system was performed. It is negative other than that mentioned in the history of present illness.   PHYSICAL EXAM   BP 119/79  Pulse 61  Ht 5' (1.524 m)  Wt 150 lb 4 oz (68.153 kg)  BMI 29.34 kg/m2 Constitutional: She is oriented to person, place, and time. She appears well-developed and well-nourished. No distress.  HENT: No nasal discharge.  Head: Normocephalic and atraumatic.  Eyes: Pupils are equal and round. No discharge.  Neck: Normal range of motion. Neck supple. No JVD present. No thyromegaly present.  Cardiovascular: Normal rate, regular rhythm, normal heart sounds. Exam reveals no gallop and no friction rub. No murmur heard.  Pulmonary/Chest: Effort normal and breath sounds normal. No stridor. No respiratory distress. She has no wheezes. She has no rales. She exhibits no tenderness.  Abdominal: Soft. Bowel sounds are normal. She exhibits no distension. There is no tenderness. There is no rebound and no guarding.  Musculoskeletal: Normal range of motion. She exhibits no edema and no tenderness.  Neurological: She is alert and oriented to person, place, and time. Coordination normal.  Skin: Skin is warm and dry. No rash noted. She is not diaphoretic. No erythema. No pallor.  Psychiatric: She has a normal mood and affect. Her behavior is normal. Judgment and thought content normal.     CHE:NIDPO  Rhythm  Low voltage in precordial leads.   -Poor R-wave progression -may be secondary to pulmonary disease   consider old anterior infarct.   -  Nonspecific T-abnormality.   ABNORMAL     ASSESSMENT AND PLAN

## 2014-10-08 ENCOUNTER — Ambulatory Visit (INDEPENDENT_AMBULATORY_CARE_PROVIDER_SITE_OTHER): Payer: BC Managed Care – PPO

## 2014-10-08 DIAGNOSIS — E538 Deficiency of other specified B group vitamins: Secondary | ICD-10-CM

## 2014-10-08 MED ORDER — CYANOCOBALAMIN 1000 MCG/ML IJ SOLN
1000.0000 ug | Freq: Once | INTRAMUSCULAR | Status: AC
  Administered 2014-10-08: 1000 ug via INTRAMUSCULAR

## 2014-10-14 ENCOUNTER — Encounter: Payer: Self-pay | Admitting: Family Medicine

## 2014-10-14 ENCOUNTER — Ambulatory Visit (INDEPENDENT_AMBULATORY_CARE_PROVIDER_SITE_OTHER): Payer: BC Managed Care – PPO | Admitting: Family Medicine

## 2014-10-14 VITALS — BP 128/70 | HR 86 | Temp 98.1°F | Wt 146.5 lb

## 2014-10-14 DIAGNOSIS — E785 Hyperlipidemia, unspecified: Secondary | ICD-10-CM

## 2014-10-14 DIAGNOSIS — F4323 Adjustment disorder with mixed anxiety and depressed mood: Secondary | ICD-10-CM

## 2014-10-14 DIAGNOSIS — R079 Chest pain, unspecified: Secondary | ICD-10-CM

## 2014-10-14 LAB — COMPREHENSIVE METABOLIC PANEL
ALBUMIN: 3.8 g/dL (ref 3.5–5.2)
ALK PHOS: 101 U/L (ref 39–117)
ALT: 53 U/L — ABNORMAL HIGH (ref 0–35)
AST: 27 U/L (ref 0–37)
BILIRUBIN TOTAL: 2.6 mg/dL — AB (ref 0.2–1.2)
BUN: 12 mg/dL (ref 6–23)
CO2: 31 mEq/L (ref 19–32)
CREATININE: 0.9 mg/dL (ref 0.4–1.2)
Calcium: 9.9 mg/dL (ref 8.4–10.5)
Chloride: 100 mEq/L (ref 96–112)
GFR: 74.53 mL/min (ref >60.00)
GLUCOSE: 141 mg/dL — AB (ref 70–99)
POTASSIUM: 4.2 meq/L (ref 3.5–5.1)
Sodium: 138 mEq/L (ref 135–145)
Total Protein: 7.6 g/dL (ref 6.0–8.3)

## 2014-10-14 LAB — LIPID PANEL
CHOL/HDL RATIO: 3
Cholesterol: 156 mg/dL (ref 0–200)
HDL: 53 mg/dL (ref >39.00)
LDL CALC: 78 mg/dL (ref 0–99)
NonHDL: 103
TRIGLYCERIDES: 127 mg/dL (ref 0.0–149.0)
VLDL: 25.4 mg/dL (ref 0.0–40.0)

## 2014-10-14 MED ORDER — SERTRALINE HCL 25 MG PO TABS: 25.0000 mg | ORAL_TABLET | Freq: Every day | ORAL | Status: DC

## 2014-10-14 MED ORDER — ALPRAZOLAM 0.25 MG PO TABS: 0.2500 mg | ORAL_TABLET | Freq: Two times a day (BID) | ORAL | Status: AC | PRN

## 2014-10-14 NOTE — Assessment & Plan Note (Signed)
>  25 minutes spent in face to face time with patient, >50% spent in counselling or coordination of care Discussed tx options- refer to psychotherapy, start zoloft 25 mg daily. Xanax prn panic attacks- discussed sedation and addiction potential. She will call me with an update in a few weeks.

## 2014-10-14 NOTE — Progress Notes (Signed)
Subjective:   Patient ID: Patricia Golden, female    DOB: 1961-02-01, 53 y.o.   MRN: [id number]  Patricia Golden is a pleasant 53 y.o. year old female who presents to clinic today with Follow-up  on 10/14/2014  HPI:  Here for follow up. Was having chest pain with concerning anginal symptoms and abnormal EKG when I last saw her- referred her to Dr. Fletcher Anon. Note reviewed- last seen on 10/04/14-cardiac cath showed normal arteries and EF.  He could not rule out endothelial dysfunction but did feel symptoms were mostly correlated to stressors at work- pt agrees with this.  Stress at work has increased.  She did have an episodes of chest pain, nausea and SOB while discussing a difficult employee at work yesterday.  She denies feeling depressed.  Has not had CP since. Strong family history of anxiety and depression.  Dad has bipolar disorder.   HLD- she is taking Crestor 40 mg daily. Lab Results  Component Value Date   CHOL 235* 12/22/2013   HDL 44.80 12/22/2013   LDLCALC 49 07/24/2012   LDLDIRECT 151.3 12/22/2013   TRIG 301.0* 12/22/2013   CHOLHDL 5 12/22/2013     H/o HLD, DM, Strong FH of CAD and she herself as been told she had a small MI 12 years ago.  Diabetes is under better control.  Lab Results  Component Value Date   HGBA1C 7.1* 09/13/2014   Lab Results  Component Value Date   CHOL 235* 12/22/2013   HDL 44.80 12/22/2013   LDLCALC 49 07/24/2012   LDLDIRECT 151.3 12/22/2013   TRIG 301.0* 12/22/2013   CHOLHDL 5 12/22/2013     Does not take betablock or ASA.  Current Outpatient Prescriptions on File Prior to Visit  Medication Sig Dispense Refill  . albuterol (PROVENTIL HFA;VENTOLIN HFA) 108 (90 BASE) MCG/ACT inhaler Inhale 2 puffs into the lungs every 6 (six) hours as needed for wheezing. 1 Inhaler 3  . aspirin EC 81 MG tablet Take 1 tablet (81 mg total) by mouth daily.    . Fluticasone-Salmeterol (ADVAIR) 500-50 MCG/DOSE AEPB Inhale 1 puff into the lungs as  needed.    Marland Kitchen glucose blood (ONE TOUCH ULTRA TEST) test strip Use to test blood sugar 6 times daily as instructed 200 each 10  . Liraglutide 18 MG/3ML SOPN Inject 1.8 mg into the skin daily before breakfast. 9 pen 3  . metFORMIN (GLUCOPHAGE) 1000 MG tablet 1,000 mg 2 (two) times daily with a meal. Take 2 by mouth twice daily    . ONETOUCH DELICA LANCETS 80D MISC Use to test blood sugar 6 times daily as instructed. 200 each prn  . RELION MINI PEN NEEDLES 31G X 6 MM MISC ONE  ONCE DAILY AS DIRECTED 100 each 0  . rosuvastatin (CRESTOR) 40 MG tablet Take 1 tablet (40 mg total) by mouth daily. 30 tablet 3   No current facility-administered medications on file prior to visit.    Allergies  Allergen Reactions  . Codeine Nausea And Vomiting  . Hydromorphone     Other reaction(s): Other (See Comments) Caused chest pain.    Past Medical History  Diagnosis Date  . SIRS (systemic inflammatory response syndrome) 06/2012    secondary to pyleonephritis  . Staghorn calculus 06/2012    Dr. Yves Dill is urologist  . DM (diabetes mellitus)   . Hyperlipidemia   . Asthma     Past Surgical History  Procedure Laterality Date  . Cholecystectomy    . Tonsillectomy  and adenoidectomy    . Abdominal hysterectomy    . Oophorectomy    . Breast reduction surgery    . Tummy tuck    . Bladder suspension    . Discectomy l5 s1      required fusion 16 days after surgery  . Spinal fusion  10/2011    L5.S1  . Lithotripsy  06-Apr-2012    7 procedures   . Cardiac catheterization  09/2014    [facility]    Family History  Problem Relation Age of Onset  . Diabetes Mother   . Hyperlipidemia Mother   . Cancer Mother 26    bio duct cancer/liver/2008  . Diabetes Father   . Hyperlipidemia Father   . Heart disease Father     died heart attack; 04/06/2005  . Heart attack Father   . Cancer Sister     melanoma, another sister with colon CA at 57  . Cancer Maternal Aunt     colon CA  . Cancer Maternal Uncle     colon CA  .  Cancer Maternal Grandmother     History   Social History  . Marital Status: Divorced    Spouse Name: N/A    Number of Children: N/A  . Years of Education: N/A   Occupational History  . Not on file.   Social History Main Topics  . Smoking status: Never Smoker   . Smokeless tobacco: Never Used  . Alcohol Use: Yes     Comment: occasional  . Drug Use: No  . Sexual Activity:    Partners: Male   Other Topics Concern  . Not on file   Social History Narrative   Divorced, three children- 54, 19, and 18 yo.   Works at [employer] in [place].   Regular exercise: some   Caffeine use: coke         The PMH, PSH, Social History, Family History, Medications, and allergies have been reviewed in [facility], and have been updated if relevant.  Review of Systems  Constitutional: Negative.   Cardiovascular: Positive for chest pain. Negative for palpitations and leg swelling.  Psychiatric/Behavioral: Negative for suicidal ideas, behavioral problems, sleep disturbance, self-injury and agitation. The patient is nervous/anxious.   All other systems reviewed and are negative.  See HPI     Objective:    BP 128/70 mmHg  Pulse 86  Temp(Src) 98.1 F (36.7 C) (Oral)  Wt 146 lb 8 oz (66.452 kg)  SpO2 98%   Physical Exam  Constitutional: She appears well-developed and well-nourished. No distress.  Skin: Skin is warm and dry.  Psychiatric: She has a normal mood and affect. Her behavior is normal. Judgment and thought content normal.          Assessment & Plan:   Adjustment disorder with mixed anxiety and depressed mood - Plan: Ambulatory referral to Psychology  Hyperlipidemia - Plan: Lipid panel, Comprehensive metabolic panel  Chest pain, unspecified chest pain type No Follow-up on file.

## 2014-10-14 NOTE — Assessment & Plan Note (Signed)
Due for lipid panel recheck. She is fasting- will check labs today. Orders Placed This Encounter  Procedures  . Lipid panel  . Comprehensive metabolic panel  . Ambulatory referral to Psychology

## 2014-10-14 NOTE — Assessment & Plan Note (Signed)
Anginal symptoms. Cardiac cath reassuring. Agree this is likely stress related. See below.

## 2014-10-14 NOTE — Patient Instructions (Addendum)
Great to see you. We are starting Zoloft 25 mg daily. Please call me in a few weeks with an update.  Xanax as needed for panic attacks- please remember what we discussed- this is sedating and habit forming.  We will call you with your referral to see Dr. Rexene Edison or Clint Bolder.

## 2014-10-14 NOTE — Progress Notes (Signed)
Pre visit review using our clinic review tool, if applicable. No additional management support is needed unless otherwise documented below in the visit note. 

## 2014-10-15 ENCOUNTER — Encounter: Payer: Self-pay | Admitting: Family Medicine

## 2014-11-10 ENCOUNTER — Ambulatory Visit (INDEPENDENT_AMBULATORY_CARE_PROVIDER_SITE_OTHER): Payer: BC Managed Care – PPO | Admitting: Psychology

## 2014-11-10 ENCOUNTER — Ambulatory Visit (INDEPENDENT_AMBULATORY_CARE_PROVIDER_SITE_OTHER): Payer: BC Managed Care – PPO | Admitting: *Deleted

## 2014-11-10 DIAGNOSIS — F4322 Adjustment disorder with anxiety: Secondary | ICD-10-CM

## 2014-11-10 DIAGNOSIS — D519 Vitamin B12 deficiency anemia, unspecified: Secondary | ICD-10-CM

## 2014-11-10 MED ORDER — CYANOCOBALAMIN 1000 MCG/ML IJ SOLN
1000.0000 ug | Freq: Once | INTRAMUSCULAR | Status: AC
  Administered 2014-11-10: 1000 ug via INTRAMUSCULAR

## 2014-12-01 ENCOUNTER — Ambulatory Visit: Payer: BC Managed Care – PPO | Admitting: Psychology

## 2014-12-01 ENCOUNTER — Ambulatory Visit: Payer: BC Managed Care – PPO | Admitting: Internal Medicine

## 2014-12-01 ENCOUNTER — Telehealth: Payer: Self-pay | Admitting: Family Medicine

## 2014-12-01 NOTE — Telephone Encounter (Signed)
Patient did not come for their scheduled appointment today for ear pain.  Please let me know if the patient needs to be contacted immediately for follow up or if no follow up is necessary.

## 2014-12-01 NOTE — Telephone Encounter (Signed)
No follow up needed

## 2014-12-08 ENCOUNTER — Telehealth: Payer: Self-pay | Admitting: Internal Medicine

## 2014-12-08 NOTE — Telephone Encounter (Signed)
Patient would like a refill of Metformin 1000 mg

## 2014-12-14 ENCOUNTER — Ambulatory Visit: Payer: BC Managed Care – PPO | Admitting: Internal Medicine

## 2014-12-14 ENCOUNTER — Other Ambulatory Visit: Payer: Self-pay | Admitting: *Deleted

## 2014-12-14 ENCOUNTER — Ambulatory Visit (INDEPENDENT_AMBULATORY_CARE_PROVIDER_SITE_OTHER): Payer: BC Managed Care – PPO | Admitting: *Deleted

## 2014-12-14 DIAGNOSIS — D519 Vitamin B12 deficiency anemia, unspecified: Secondary | ICD-10-CM

## 2014-12-14 MED ORDER — CYANOCOBALAMIN 1000 MCG/ML IJ SOLN
1000.0000 ug | Freq: Once | INTRAMUSCULAR | Status: AC
  Administered 2014-12-14: 1000 ug via INTRAMUSCULAR

## 2014-12-14 MED ORDER — METFORMIN HCL 1000 MG PO TABS: ORAL_TABLET | ORAL | Status: DC

## 2014-12-14 NOTE — Telephone Encounter (Signed)
Done

## 2014-12-14 NOTE — Telephone Encounter (Signed)
    Expand All Collapse All   Patient would like a refill of Metformin 1000 mg, she is out pharmacy [facility] in [place].

## 2014-12-15 ENCOUNTER — Ambulatory Visit: Payer: BC Managed Care – PPO

## 2014-12-28 ENCOUNTER — Encounter: Payer: Self-pay | Admitting: Family Medicine

## 2014-12-28 NOTE — Telephone Encounter (Signed)
Spoke to pt and scheduled appt

## 2014-12-29 ENCOUNTER — Encounter: Payer: Self-pay | Admitting: Family Medicine

## 2014-12-29 ENCOUNTER — Ambulatory Visit: Payer: BC Managed Care – PPO | Admitting: Family Medicine

## 2014-12-29 ENCOUNTER — Ambulatory Visit (INDEPENDENT_AMBULATORY_CARE_PROVIDER_SITE_OTHER): Payer: BC Managed Care – PPO | Admitting: Family Medicine

## 2014-12-29 VITALS — BP 124/78 | HR 63 | Temp 97.8°F | Wt 153.0 lb

## 2014-12-29 DIAGNOSIS — F4323 Adjustment disorder with mixed anxiety and depressed mood: Secondary | ICD-10-CM

## 2014-12-29 MED ORDER — SERTRALINE HCL 25 MG PO TABS: 25.0000 mg | ORAL_TABLET | Freq: Every day | ORAL | Status: AC

## 2014-12-29 NOTE — Patient Instructions (Signed)
Good to see you. Hang in there.  Please call me with an update.

## 2014-12-29 NOTE — Progress Notes (Signed)
Subjective:   Patient ID: Patricia Golden, female    DOB: 02-24-1961, 54 y.o.   MRN: [id number]  Patricia Golden is a pleasant 54 y.o. year old female who presents to clinic today with Anxiety  on 12/29/2014  HPI:  I saw her on 10/14/14 for anxiety- Note reviewed. Stressors had increased at home and at work, but mainly at work.  Was having CP and SOB while dealing with a difficult employee at work.  Dad also has h/o bipolar disorder. Saw Dr. Fletcher Anon for cardiac work up- neg cath and felt CP was SOB was due to anxiety.  Denied feeling depressed- we started her on zoloft 25 mg daily with prn xanax (has not taken any) and referred her for psychotherapy which she did go to one time.  Zoloft has improved her symptoms of insomnia and anxiety until yesterday.  Actually felt she was able to cope better and focus on her tasks at work.  Feeling very overwhelmed at work.  Her boss just told her he is resigning and she may lose her current job. Feels she needs some days off of work to help her self cope with this.  Current Outpatient Prescriptions on File Prior to Visit  Medication Sig Dispense Refill  . albuterol (PROVENTIL HFA;VENTOLIN HFA) 108 (90 BASE) MCG/ACT inhaler Inhale 2 puffs into the lungs every 6 (six) hours as needed for wheezing. 1 Inhaler 3  . ALPRAZolam (XANAX) 0.25 MG tablet Take 1 tablet (0.25 mg total) by mouth 2 (two) times daily as needed for anxiety. 20 tablet 0  . aspirin EC 81 MG tablet Take 1 tablet (81 mg total) by mouth daily.    . Fluticasone-Salmeterol (ADVAIR) 500-50 MCG/DOSE AEPB Inhale 1 puff into the lungs as needed.    Marland Kitchen glucose blood (ONE TOUCH ULTRA TEST) test strip Use to test blood sugar 6 times daily as instructed 200 each 10  . Liraglutide 18 MG/3ML SOPN Inject 1.8 mg into the skin daily before breakfast. 9 pen 3  . metFORMIN (GLUCOPHAGE) 1000 MG tablet Take 1 tablet by mouth twice daily 60 tablet 1  . ONETOUCH DELICA LANCETS 82U MISC Use to test blood sugar  6 times daily as instructed. 200 each prn  . RELION MINI PEN NEEDLES 31G X 6 MM MISC ONE  ONCE DAILY AS DIRECTED 100 each 0  . rosuvastatin (CRESTOR) 40 MG tablet Take 1 tablet (40 mg total) by mouth daily. 30 tablet 3   No current facility-administered medications on file prior to visit.    Allergies  Allergen Reactions  . Codeine Nausea And Vomiting  . Hydromorphone     Other reaction(s): Other (See Comments) Caused chest pain.    Past Medical History  Diagnosis Date  . SIRS (systemic inflammatory response syndrome) 06/2012    secondary to pyleonephritis  . Staghorn calculus 06/2012    Dr. Yves Dill is urologist  . DM (diabetes mellitus)   . Hyperlipidemia   . Asthma     Past Surgical History  Procedure Laterality Date  . Cholecystectomy    . Tonsillectomy and adenoidectomy    . Abdominal hysterectomy    . Oophorectomy    . Breast reduction surgery    . Tummy tuck    . Bladder suspension    . Discectomy l5 s1      required fusion 16 days after surgery  . Spinal fusion  10/2011    L5.S1  . Lithotripsy  2013    7 procedures   .  Cardiac catheterization  09/2014    [facility]    Family History  Problem Relation Age of Onset  . Diabetes Mother   . Hyperlipidemia Mother   . Cancer Mother 39    bio duct cancer/liver/2008  . Diabetes Father   . Hyperlipidemia Father   . Heart disease Father     died heart attack; 24-Mar-2005  . Heart attack Father   . Cancer Sister     melanoma, another sister with colon CA at 32  . Cancer Maternal Aunt     colon CA  . Cancer Maternal Uncle     colon CA  . Cancer Maternal Grandmother     History   Social History  . Marital Status: Divorced    Spouse Name: N/A    Number of Children: N/A  . Years of Education: N/A   Occupational History  . Not on file.   Social History Main Topics  . Smoking status: Never Smoker   . Smokeless tobacco: Never Used  . Alcohol Use: Yes     Comment: occasional  . Drug Use: No  . Sexual Activity:     Partners: Male   Other Topics Concern  . Not on file   Social History Narrative   Divorced, three children- 72, 59, and 82 yo.   Works at [employer] in [place].   Regular exercise: some   Caffeine use: coke               Review of Systems  Constitutional: Negative.   Cardiovascular: Negative.   Psychiatric/Behavioral: Negative for suicidal ideas, behavioral problems, confusion, self-injury, dysphoric mood and agitation. The patient is nervous/anxious.   All other systems reviewed and are negative.      Objective:    BP 124/78 mmHg  Pulse 63  Temp(Src) 97.8 F (36.6 C) (Oral)  Wt 153 lb (69.4 kg)  SpO2 97%   Physical Exam  Constitutional: She appears well-developed and well-nourished. No distress.  HENT:  Head: Normocephalic.  Eyes: Conjunctivae are normal.  Pulmonary/Chest: Effort normal.  Skin: Skin is warm and dry.  Psychiatric: She has a normal mood and affect. Her behavior is normal. Judgment and thought content normal.  Nursing note and vitals reviewed.         Assessment & Plan:   Adjustment disorder with mixed anxiety and depressed mood No Follow-up on file.

## 2014-12-29 NOTE — Assessment & Plan Note (Signed)
>  25 minutes spent in face to face time with patient, >50% spent in counselling or coordination of care Acutely deteriorated given new stressors at work. Zoloft was helping at current dose- will continue this. Note given keeping her out of work for 6 days. Call or return to clinic prn if these symptoms worsen or fail to improve as anticipated. The patient indicates understanding of these issues and agrees with the plan.

## 2014-12-29 NOTE — Progress Notes (Signed)
Pre visit review using our clinic review tool, if applicable. No additional management support is needed unless otherwise documented below in the visit note. 

## 2015-01-07 IMAGING — CR DG CHEST 2V
1 series · 2 of 2 positions shown · non-contrast
Comparison: None.

CLINICAL DATA: Preop cardiac catheterization.  Chest pain

EXAM:
CHEST  2 VIEW

[Series 1: dxr chest pa (or ap) and lateral · 0.14mm/px · 2 of 2 slices shown]
[im 1/2]
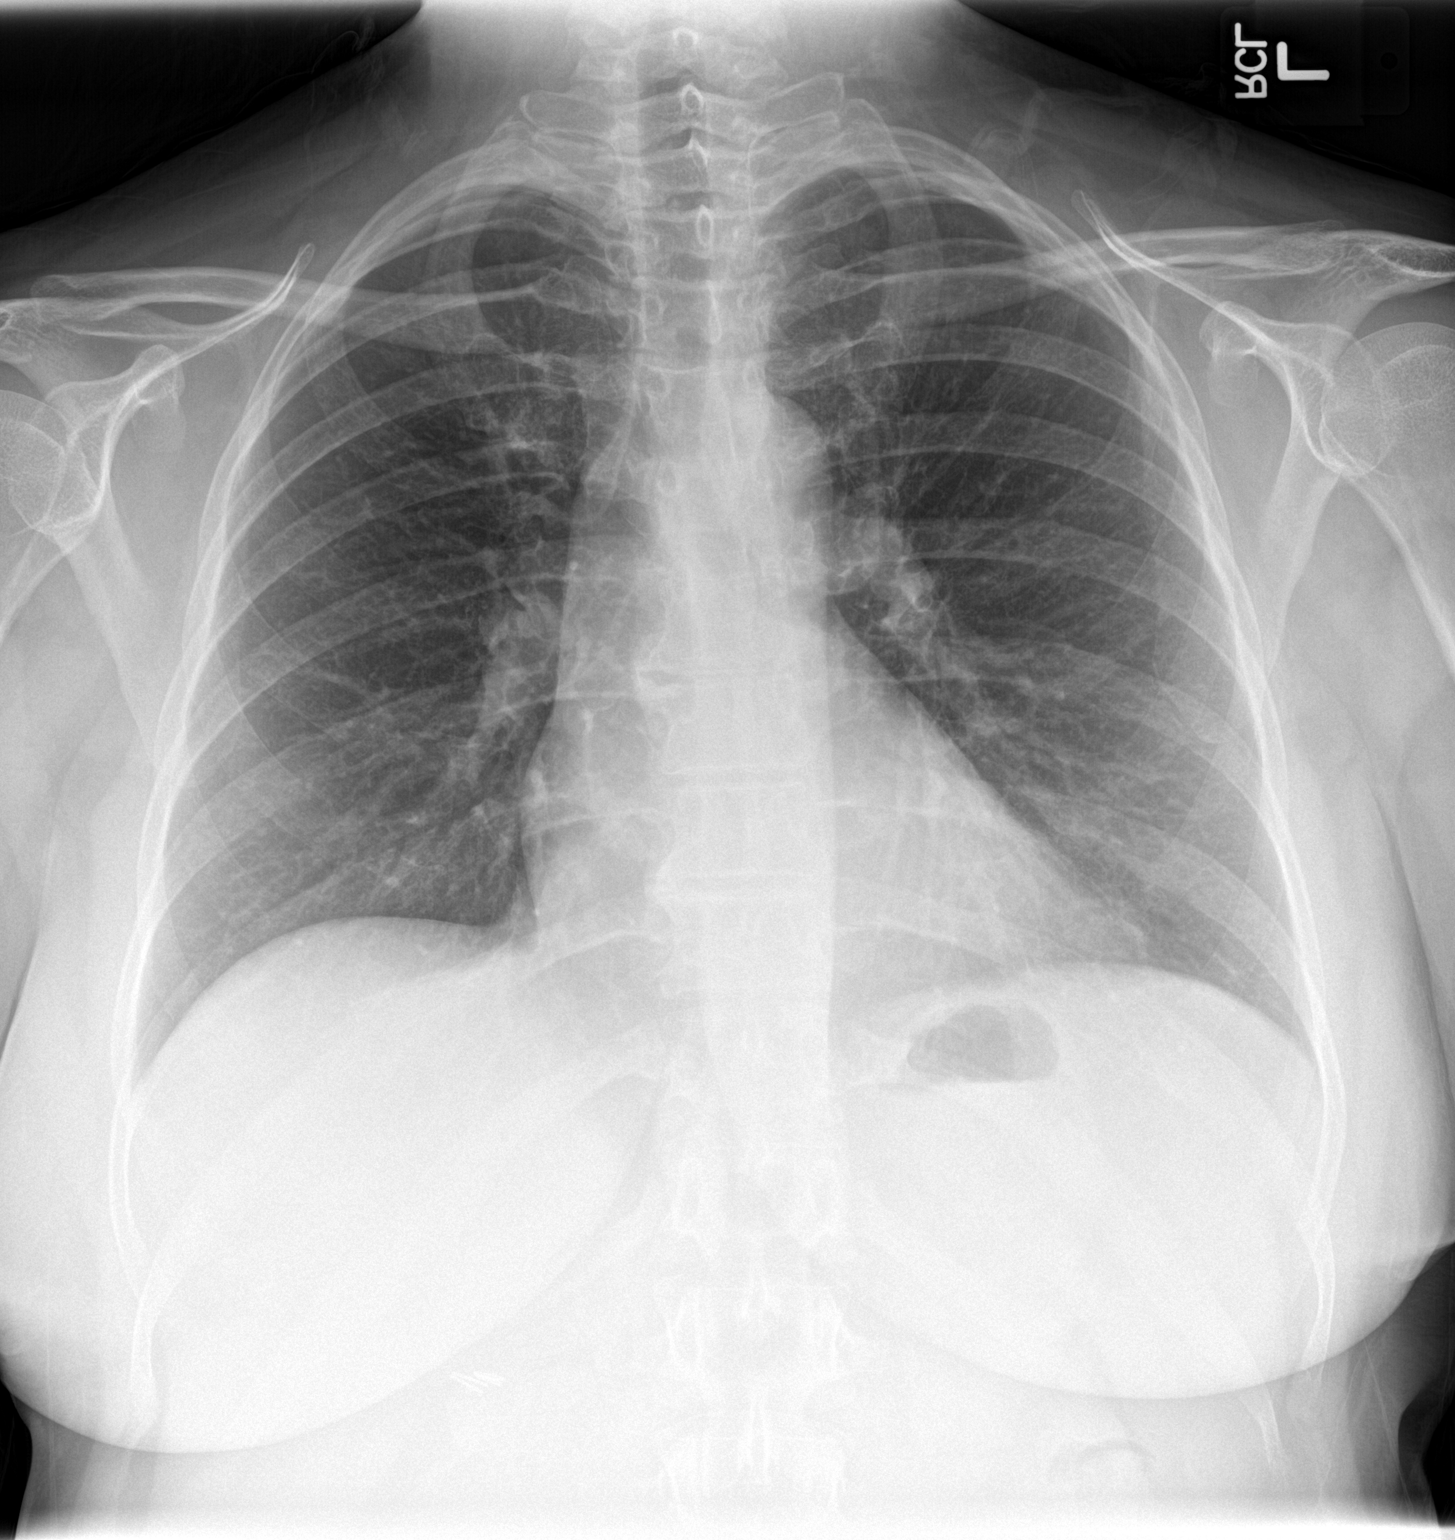
[im 2/2]
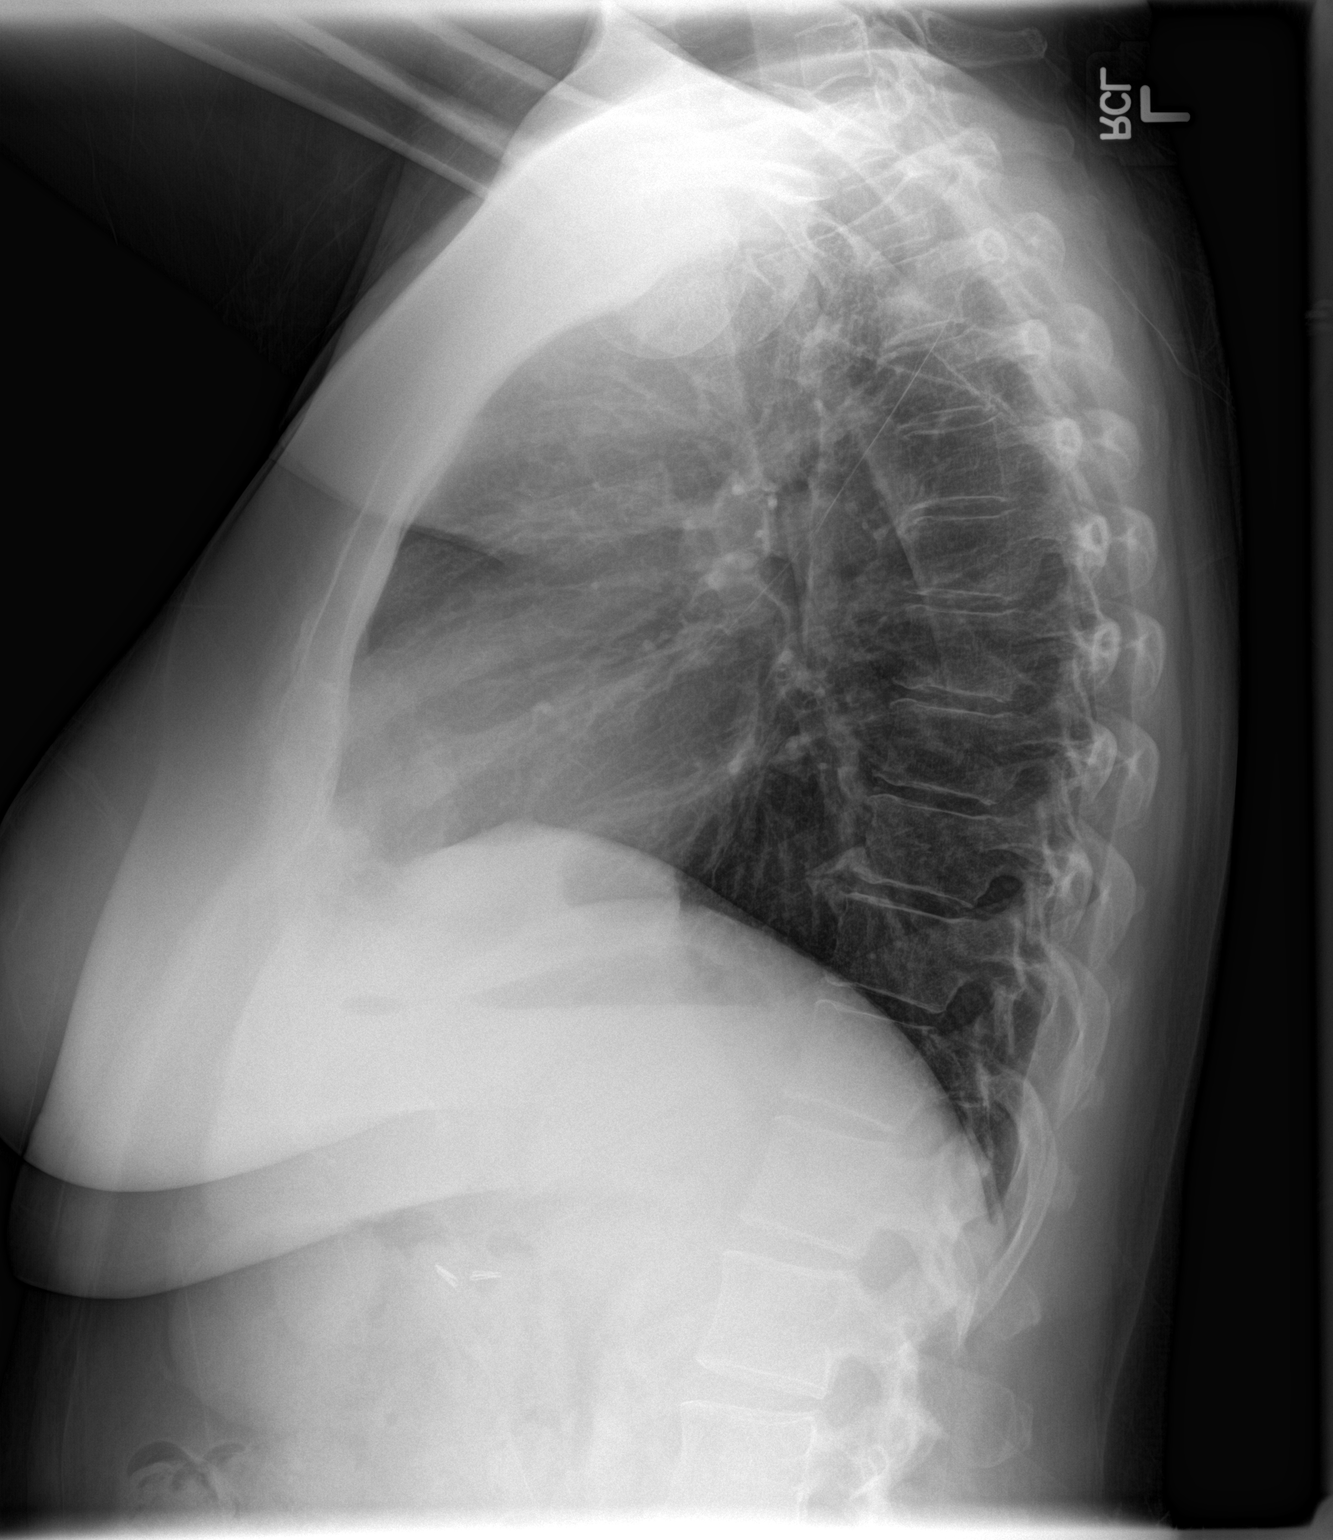

[2 of 2 positions shown; findings below may reference images not displayed]

FINDINGS: The heart size and mediastinal contours are within normal limits.
Both lungs are clear. The visualized skeletal structures are
unremarkable.
IMPRESSION: No active cardiopulmonary disease.

## 2015-01-10 ENCOUNTER — Encounter: Payer: Self-pay | Admitting: Internal Medicine

## 2015-01-10 ENCOUNTER — Ambulatory Visit (INDEPENDENT_AMBULATORY_CARE_PROVIDER_SITE_OTHER): Payer: BC Managed Care – PPO | Admitting: Internal Medicine

## 2015-01-10 VITALS — BP 120/64 | HR 85 | Temp 98.1°F | Resp 12 | Wt 150.0 lb

## 2015-01-10 DIAGNOSIS — E1165 Type 2 diabetes mellitus with hyperglycemia: Secondary | ICD-10-CM

## 2015-01-10 DIAGNOSIS — IMO0002 Reserved for concepts with insufficient information to code with codable children: Secondary | ICD-10-CM

## 2015-01-10 DIAGNOSIS — E1140 Type 2 diabetes mellitus with diabetic neuropathy, unspecified: Secondary | ICD-10-CM

## 2015-01-10 LAB — HEMOGLOBIN A1C: Hgb A1c MFr Bld: 8.6 % — ABNORMAL HIGH (ref 4.6–6.5)

## 2015-01-10 NOTE — Progress Notes (Signed)
Patient ID: Patricia Golden, female   DOB: July 06, 1961, 54 y.o.   MRN: [id number]  HPI: Patricia Golden is a 54 y.o.-year-old female, returning for f/u for DM2, dx 2007, non-insulin-dependent, uncontrolled, with complications (peripheral neuropathy). Last visit 4 mo ago.  Last hemoglobin A1c was: Lab Results  Component Value Date   HGBA1C 7.1* 09/13/2014   HGBA1C 6.4 03/29/2014   HGBA1C 6.9* 12/22/2013   Pt is on a regimen of: - Metformin 1000 mg po bid - Victoza 1.8 mg daily She was on Actos >> generalized pain and weight gain. She was on Glipizide XL 2.5 mg >> stopped b/c lows.  Pt checks her sugars 1x a day (no log) >> sugars higher in am (fast food at night!): - am: 100-300 or higher >> 108-181 >> 110-125 (highest 130) >> 90-110 >> 90s, 110-120 >> 150-170s - 2h after b'fast: 126-178 >> 130-140 >> n/c >> 110-120 - before lunch: 100-300s >> 120-154 >> 130s >> 120s >> n/c - 2h after lunch: 134-170 >>130-140 >> n/c >> 120-130, 154  >> n/c - before dinner: 100-300s >> 154-176 >> up to 140 >> n/c >> 130-135 - bedtime: 200-300s >> 136-161 >>  115-120 >> up to 155 >> n/c >> 100-120 Rarely lows. Lowest sugar was 90; she has hypoglycemia awareness at 70-75. Highest sugar was high 155.  - no CKD, last BUN/creatinine:  Lab Results  Component Value Date   BUN 12 10/14/2014   CREATININE 0.9 10/14/2014  last ACR 3.4 on 09/14/2013. Not on ACE inhibitors. - Has a very high LDL, last set of lipids: Lab Results  Component Value Date   CHOL 156 10/14/2014   HDL 53.00 10/14/2014   LDLCALC 78 10/14/2014   LDLDIRECT 151.3 12/22/2013   TRIG 127.0 10/14/2014   CHOLHDL 3 10/14/2014  She restarted Crestor.  - last eye exam was in 05/2014. No DR. Has eye exam yearly. - no numbness and tingling in her feet. Foot exam normal as performed by PCP on 09/14/2013.   She had a complicated kidney stone  In 11/2013 >> stent placed >> infection >> stone retrieval.  She started Zoloft for stress.  I  reviewed pt's medications, allergies, PMH, social hx, family hx, and changes were documented in the history of present illness. Otherwise, unchanged from my initial visit note.  ROS: Constitutional:no weight gain, no fatigue, + hot flushes Eyes: no blurry vision, no xerophthalmia ENT: no sore throat, no nodules palpated in throat, no dysphagia/odynophagia, + hoarseness Cardiovascular: + CP/no SOB/+ palpitations/no leg swelling Respiratory: no cough/SOB Gastrointestinal: no N/V/D/C Musculoskeletal: no  muscle aches/joint aches Skin: no rashes Neurological: no tremors/numbness/tingling/dizziness  I reviewed pt's medications, allergies, PMH, social hx, family hx, and changes were documented in the history of present illness. Otherwise, unchanged from my initial visit note.  PE: BP 120/64 mmHg  Pulse 85  Temp(Src) 98.1 F (36.7 C) (Oral)  Resp 12  Wt 150 lb (68.04 kg)  SpO2 97% Body mass index is 29.3 kg/(m^2).  Wt Readings from Last 3 Encounters:  01/10/15 150 lb (68.04 kg)  12/29/14 153 lb (69.4 kg)  10/14/14 146 lb 8 oz (66.452 kg)   Constitutional: overweight, in NAD Eyes: PERRLA, EOMI, no exophthalmos ENT: moist mucous membranes, no thyromegaly, no cervical lymphadenopathy Cardiovascular: RRR, No MRG Respiratory: CTA B Gastrointestinal: abdomen soft, NT, ND, BS+ Musculoskeletal: no deformities, strength intact in all 4 Skin: moist, warm, no rashes Neurological: no tremor with outstretched hands, DTR normal in all 4  ASSESSMENT:  1. DM2, non-insulin-dependent, uncontrolled, with complications - PN  PLAN:  1. Patient with long-standing, uncontrolled diabetes, with increased sugars in am as she was working late and started to go to [employer] work >> advised to stop doing this but will not change regimen for now.  - I suggested to:  Patient Instructions  Continue Victoza 1.8 mg daily Continue Metformin 1000 mg 2x daily. Please stop at the lab. Please come back for  a follow-up appointment in 3 months.  - she is up to date with yearly eye exams - will check a new A1c today - Return to clinic in 3 mo with sugar log   Office Visit on 01/10/2015  Component Date Value Ref Range Status  . Hgb A1c MFr Bld 01/10/2015 8.6* 4.6 - 6.5 % Final   Glycemic Control Guidelines for People with Diabetes:Non Diabetic:  <6%Goal of Therapy: <7%Additional Action Suggested:  >8%    HbA1c is higher >> see above plan to improve diet.

## 2015-01-10 NOTE — Patient Instructions (Addendum)
Continue Victoza 1.8 mg daily Continue Metformin 1000 mg 2x daily.  Please stop at the lab.  Please come back for a follow-up appointment in 3 months.

## 2015-01-14 ENCOUNTER — Ambulatory Visit: Payer: BC Managed Care – PPO

## 2015-01-21 ENCOUNTER — Ambulatory Visit (INDEPENDENT_AMBULATORY_CARE_PROVIDER_SITE_OTHER): Payer: BC Managed Care – PPO | Admitting: *Deleted

## 2015-01-21 DIAGNOSIS — E538 Deficiency of other specified B group vitamins: Secondary | ICD-10-CM

## 2015-01-21 MED ORDER — CYANOCOBALAMIN 1000 MCG/ML IJ SOLN
1000.0000 ug | Freq: Once | INTRAMUSCULAR | Status: AC
  Administered 2015-01-21: 1000 ug via INTRAMUSCULAR

## 2015-02-04 ENCOUNTER — Telehealth: Payer: Self-pay | Admitting: Family Medicine

## 2015-02-04 NOTE — Telephone Encounter (Signed)
See below my chart message   Appointment Request From: Patricia Golden       With Provider: Arnette Norris, MD [-Primary Care Physician-]      Preferred Date Range: Any date 02/04/2015 or later      Preferred Times: Any      Reason for visit: Office Visit      Comments:   Hi Dr. Deborra Medina,      when I had my last physical we discussed I was due for a colonoscopy. When [name]? went to schedule it with Dr. Jenetta Downer she was told that I wasn't due until October and that they would notify me to schedule. I have not heard from them and was wondering if we could go ahead and get that scheduled?

## 2015-02-04 NOTE — Telephone Encounter (Signed)
Will forward to [facility].

## 2015-02-16 ENCOUNTER — Other Ambulatory Visit: Payer: Self-pay | Admitting: *Deleted

## 2015-02-16 ENCOUNTER — Telehealth: Payer: Self-pay | Admitting: Internal Medicine

## 2015-02-16 MED ORDER — GLUCOSE BLOOD VI STRP: ORAL_STRIP | Status: AC

## 2015-02-16 MED ORDER — LIRAGLUTIDE 18 MG/3ML ~~LOC~~ SOPN: 1.8000 mg | PEN_INJECTOR | Freq: Every day | SUBCUTANEOUS | Status: DC

## 2015-02-16 MED ORDER — METFORMIN HCL 1000 MG PO TABS: ORAL_TABLET | ORAL | Status: DC

## 2015-02-16 NOTE — Telephone Encounter (Signed)
Please read message below and advise if ok to refill for a 90 day supply. Does pt need to see you prior to moving? Please advise.

## 2015-02-16 NOTE — Telephone Encounter (Signed)
Pt will be moving to [facility] and is asking if we could call in her rx for a 90 day supply to hold her until she can find a endo md in [place] she will be in [place] on easter weekend. If you need to see her please let us know.

## 2015-02-16 NOTE — Telephone Encounter (Signed)
Noted, rx's have been sent. 

## 2015-02-16 NOTE — Telephone Encounter (Signed)
OK to send.

## 2015-02-22 ENCOUNTER — Ambulatory Visit: Payer: BC Managed Care – PPO

## 2015-02-23 ENCOUNTER — Telehealth: Payer: Self-pay | Admitting: Internal Medicine

## 2015-02-23 NOTE — Telephone Encounter (Signed)
Pt calling about 90 dy supply for victoza and not metfomin. She is returning your call she has insurance questions

## 2015-02-24 ENCOUNTER — Other Ambulatory Visit: Payer: Self-pay | Admitting: *Deleted

## 2015-02-24 ENCOUNTER — Ambulatory Visit: Payer: Self-pay | Admitting: Gastroenterology

## 2015-02-24 MED ORDER — METFORMIN HCL 1000 MG PO TABS: ORAL_TABLET | ORAL | Status: AC

## 2015-02-24 MED ORDER — LIRAGLUTIDE 18 MG/3ML ~~LOC~~ SOPN: 1.8000 mg | PEN_INJECTOR | Freq: Every day | SUBCUTANEOUS | Status: AC

## 2015-02-24 NOTE — Telephone Encounter (Signed)
Rx refills sent in to pt's pharmacy. Called pt and advised her per Dr Arman Filter message. Advised pt to call if she has any further questions.

## 2015-02-24 NOTE — Telephone Encounter (Signed)
Ok to call those in. I saw her recently. I do not think I will change her meds and we already discussed about improving diet. I will leave it up to her if decide if she wants to come before moving.  I am sorry to lose her as a patient! But I know she will like [place]!

## 2015-02-24 NOTE — Telephone Encounter (Signed)
Returned pt's call. Pt did need both Victoza and Metformin. Pt asked if Dr Cruzita Lederer would like to see her before she moves to [place]? Leaving in a couple of weeks. Please advise.

## 2015-02-28 ENCOUNTER — Encounter: Payer: Self-pay | Admitting: Family Medicine

## 2015-03-01 ENCOUNTER — Ambulatory Visit: Payer: BC Managed Care – PPO

## 2015-03-01 MED ORDER — ROSUVASTATIN CALCIUM 40 MG PO TABS: 40.0000 mg | ORAL_TABLET | Freq: Every day | ORAL | Status: AC

## 2015-03-02 ENCOUNTER — Ambulatory Visit (INDEPENDENT_AMBULATORY_CARE_PROVIDER_SITE_OTHER): Payer: BC Managed Care – PPO

## 2015-03-02 DIAGNOSIS — E538 Deficiency of other specified B group vitamins: Secondary | ICD-10-CM

## 2015-03-02 MED ORDER — CYANOCOBALAMIN 1000 MCG/ML IJ SOLN
1000.0000 ug | Freq: Once | INTRAMUSCULAR | Status: AC
  Administered 2015-03-02: 1000 ug via INTRAMUSCULAR

## 2015-03-25 ENCOUNTER — Ambulatory Visit: Payer: BC Managed Care – PPO

## 2015-03-29 NOTE — Discharge Summary (Signed)
PATIENT NAME:  Patricia Golden, Patricia Golden MR#:  [id number] DATE OF BIRTH:  1961/02/19  DATE OF ADMISSION:  07/08/2012 DATE OF DISCHARGE:  07/10/2012  ADMITTING DIAGNOSIS: Acute pyelonephritis.   DISCHARGE DIAGNOSES:  1. Systemic inflammatory response syndrome. 2. Acute pyelonephritis. 3. Staghorn right kidney stone.   4. Leukocytosis, resolved on antibiotic therapy.  5. Hypertension, resolved on intravenous fluids. 6. Fluid overload with increasing shortness of breath, resolved.  7. Diabetes mellitus with hemoglobin A1c 9.1.  8. Hyponatremia, resolved.   DISCHARGE CONDITION: Stable.   DISCHARGE MEDICATIONS: Patient is to resume her outpatient medications which are:  1. Metformin 1 gram twice daily. 2. Crestor 20 mg p.o. daily.   ADDITIONAL MEDICATIONS:  1. Ciprofloxacin 500 mg p.o. twice daily. 2. Tramadol 50 to 100 mg q.4 hours as needed. 3. Glipizide 2.5 mg p.o. daily.  4. Patient is not to take lisinopril unless recommended by primary care physician.   DIET: 2 grams salt, low fat, low cholesterol, carbohydrate controlled diet, regular consistency.   HOME OXYGEN: None.   ACTIVITY LIMITATIONS: As tolerated.  FOLLOW UP: Follow-up appointment at [facility] in two days after discharge as well as Dr. Yves Dill in two weeks after discharge.    CONSULTANTS: None.   HISTORY OF PRESENT ILLNESS: Patient is a 54 year old Caucasian female with past medical history significant for history of kidney stones who presented to the hospital with complaints of right flank pain. Please refer to Dr. Keenan Bachelor admission note on 07/08/2012. On arrival to Emergency Room patient's temperature 97.9, pulse 86, respirations rate 20, blood pressure 128/64, saturation 99% on room air. Physical exam revealed CVA tenderness on the right as well as some tenderness in the right upper quadrant and voluntary guarding but no rebound or guarding otherwise was noted.   LABORATORY, DIAGNOSTIC AND RADIOLOGICAL DATA:  CT scan of abdomen and pelvis without contrast for stone 07/08/2012 showed prominent staghorn calculus on the right. No hydronephrosis is present. The right kidney is swollen with perinephric fat plane streaking. These changes suggest the presence of pyelonephritis in the right. Left nephrolithiasis was also noted. 1 cm stone in the left renal collecting system was noted. Minimal calyceal dilatation is noted in the upper pole. Bladder nondistended. Phleboliths in pelvis. Appendix normal. No bowel distention. No free air. Lung bases are clear.   Lab data on arrival to the hospital: Glucose level 346, sodium 135, otherwise BMP was unremarkable. CBC: White blood cell count of 13.2, hemoglobin 13.0, platelet count 260. Blood cultures taken on admission 07/08/2012 showed no growth. Urinalysis was remarkable for yellow cloudy urine, more than 500 glucose, negative for bilirubin or ketones, specific gravity 1.016, pH 6.0, 2+ blood, 30 mg/dL protein, positive for nitrites, 3+ leukocytes esterase, 56 red blood cells, 169 white blood cells, 1+ bacteria, less than 1 epithelial cell, white blood cell clumps as well as mucus were present.    HOSPITAL COURSE: Patient is admitted to the hospital. She was started on antibiotic therapy. With antibiotic therapy her condition somewhat improved, however, patient did have pain and her blood pressure was also somewhat lower intermittently. She was rehydrated but with rehydration she developed some puffiness in her subcutaneous tissues especially periorbital area as well as her fingers. She also had crackles on the right side signifying fluid overload. She was started on Rocephin initially because of concerns of an infection. Patient was switched to Zosyn, however, after discussion with Dr. Yves Dill who felt that staghorn calculi are usually produced by proteus bacteria and felt that fluoroquinolone  would cover this quite well. Patient was initiated on ciprofloxacin what antibiotic she  is going to be discharged on. Patient is to follow up with Dr. Yves Dill in the next two weeks after discharge. She is to continue pain medications as needed.   In regards to hypertension, initially it was felt that patient's hypertension could have been related to systemic anti-inflammatory response reaction. She was given, as mentioned above, IV fluids after which patient's hypertension resolved. Patient's blood pressure remained stable even after low dose diuresis.   For her diabetes mellitus type 2, patient's hemoglobin A1c was checked, was found to be 9.1. Patient was started on glipizide at 2.5 mg p.o. daily dose which needs to be advanced as necessary to control patient's hyperglycemia.  In regards to hyponatremia, which patient presented with, sodium level of 135, this resolved with IV fluid administration and sodium level normalized to 140 on 07/09/2012.    With rehydration patient was noted to be anemic. Hemoglobin level of 13.0 was noted on 07/08/2012, however, patient's hemoglobin level was 10.2 by 07/10/2012. It was felt to be rehydration related. As mentioned above, patient was fluid overloaded. Of note, patient's urine cultures as well as blood cultures did not show any growth. Patient did receive blood cultures taken on 07/08/2012 on admission to the hospital and then later on they  were repeated on the 07/09/2012 because she developed high fevers. Urine cultures were first checked on 07/08/2012 showed no growth; repeated on 07/09/2012 also showed no growth.   TIME SPENT: 40 minutes.  ____________________________ Theodoro Grist, MD rv:cms D: 07/10/2012 17:13:44 ET T: 07/10/2012 17:33:16 ET JOB#: [id number]  cc: Theodoro Grist, MD, <Dictator> [place] Yves Dill, MD  Kyriaki Moder Ether Griffins MD ELECTRONICALLY SIGNED 07/14/2012 1:13

## 2015-03-29 NOTE — H&P (Signed)
PATIENT NAME:  Patricia Golden, BALLESTER MR#:  [id number] DATE OF BIRTH:  24-Jun-1961  DATE OF ADMISSION:  07/08/2012  PRIMARY CARE PHYSICIAN: Unknown MD  at [facility] in [place]: Patient is a 54 year old Caucasian female with past medical history significant for history of kidney stone, history of hyperlipidemia, diabetes mellitus presented to the hospital with complaints of gripping pain in right side of her flank. According to patient she was doing well up until approximately 9:00 to 9:30 last evening. She was just sitting and suddenly started having gripping pain on right side of her flank. She knew that she was having kidney stones and was concerned that she was passing kidney stones. However, she denied any radiation of the pain or shooting pain in the groin. She also was having chills all night long, however, did not take her temperature and did not measure her temperature. She was also nauseated. On arrival to Emergency Room she was noted to have normal vital signs with no high fevers, however, her labs revealed elevated white blood cell count, mild dehydration as well as significant hyperglycemia with glucose levels of above 300. Her urinalysis also was positive for pyuria and bacteruria. CT scan of her abdomen and pelvis was performed to rule out stone on 07/08/2012 and revealed prominent staghorn calculus and right swollen kidney as well as nephrolithiasis on the left, however, no obstruction. Hospitalist service was contacted for admission.   PAST MEDICAL HISTORY:  1. History of back surgery x4 over the past recent year.  2. History of kidney stones.  3. History of hyperlipidemia.  4. Diabetes mellitus which was diagnosed 8 or 9 years ago. She used to see endocrinologist at [facility] in the middle of August. No history of nephropathy or neuropathy or retinopathy.  5. 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   MEDICATIONS:  1. Metformin 1 gram twice daily. 2. Actos, unknown dose, but patient stopped this medication because she gained weight and was swollen and was having problems with shortness of breath. 3. Lisinopril 2.5 mg p.o. daily but this medication according to patient was given not for blood pressure management but to prevent her nephropathy. 4. Crestor 10 mg p.o. at bedtime.   ALLERGIES: Codeine which gives her dizziness.  PAST SURGICAL HISTORY: 1. Gallbladder surgery.  2. Tonsillectomy and adenoidectomy. 3. Hysterectomy for myomas, also oophorectomy. 4. History of back surgery x4. 5. Breast reduction surgery. 6. Tummy tuck. 7. Bladder suspension.   FAMILY HISTORY: Both parents died; both parents had diabetes mellitus, also hyperlipidemia. Patient's father died of myocardial infarction. Also patient's mother had bile duct carcinoma. Two sisters had melanoma, also clinical depression. One sister had colon cancer, was diagnosed with colon cancer at age of 54, also has history of epilepsy.   SOCIAL HISTORY: Patient is divorced, has three children 16, 4 and 44 years old. Her oldest  son has three children so patient has three grandchildren. She does not smoke, never smoked. Occasional alcohol use. She works at [employer] in [place].   REVIEW OF SYSTEMS: CONSTITUTIONAL: Positive for feeling chills, pains in her right flank, weight gain, approximately 10 pounds over the past few months, ankles swollen as well as shortness of breath, eye problems. Apparently since her surgery she has some lumbar fluid drainage. She had problems with her vision. 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 Also seasonal allergies as well as year-round allergies. Cough, intermittent shortness of breath especially on exertion, some intermittent nausea especially today. Also strange bowel  habits. She tells me that she has bowel movements approximately every 3 or 4 days, however, whenever she does have bowel movement it is usually preceded by some abdominal pain, cramping and then diarrhea. She admits of having some increased frequency of urination over the day since last night, felt that she has kidney stones, she had kidney stones in the past. She was seen by urologist in the past but she does not remember who was her urologist. She denies any high fevers, fatigue or weakness or weight loss. EYES: Denies any blurry vision, double vision, glaucoma, cataracts. ENT: Denies any tinnitus, postnasal drip, sinus pain, dentures, difficulty swallowing. RESPIRATORY: Denies any wheezes, asthma, chronic obstructive pulmonary disease. CARDIOVASCULAR: Denies chest pains, orthopnea, arrhythmias, palpitations, or syncope. GASTROINTESTINAL: Denies any hematemesis, rectal bleeding, change in bowel habits. GENITOURINARY: Denies dysuria, hematuria, incontinence. ENDOCRINOLOGY: Denies any polydipsia, nocturia, thyroid problems, heat or cold intolerance, or thirst. HEMATOLOGIC: Denies anemia, easy bruising, bleeding, swollen glands. SKIN: Denies any acne, rash, lesions, change in moles. MUSCULOSKELETAL: Denies arthritis, cramps, swelling, gout. NEUROLOGICAL: No numbness, epilepsy, tremor. PSYCH: Denies anxiety, insomnia, or depression.   PHYSICAL EXAMINATION: VITAL SIGNS: Temperature 97.9, pulse 86, respiration rate 20, blood pressure 128/64, saturation 99% on room air.   GENERAL: This is a well-developed, well-nourished Caucasian female in no significant distress lying on the stretcher.   HEENT: Her pupils are equal, reactive to light. Extraocular movements are intact. No icterus or conjunctivitis. Has normal hearing. No pharyngeal erythema. Mucosa is moist.   NECK: No masses. Supple, nontender. Thyroid is not enlarged. No adenopathy. No JVD or carotid bruits bilaterally. Full range of motion.   LUNGS: Clear  to auscultation in all fields. No rales, rhonchi, diminished breath sounds or wheezing. No labored inspirations, increased effort, dullness to percussion. Not in overt respiratory distress.   CARDIOVASCULAR: S1, S2 appreciated. No murmurs, gallops or rubs noted. PMI not lateralized. Chest is nontender to palpation. 1+ pedal pulses. No lower extremity edema, calf tenderness, or cyanosis.   ABDOMEN: Soft, tender in the right upper quadrant and voluntary guarding was present but no rebound or guarding were otherwise noted. No hepatosplenomegaly or masses were noted.   RECTAL: Deferred.   MUSCULOSKELETAL: Patient is able to move all extremities. No cyanosis, degenerative joint disease, or kyphosis. Patient does have CVA tenderness in the right. Gait is not tested.   SKIN: Skin did not reveal any rashes, lesions, erythema, nodularity, induration. It was warm and dry to palpation.   LYMPH: No adenopathy in cervical region.   NEUROLOGIC: Cranial nerves grossly intact. Sensory is intact. No dysarthria, aphasia. Patient is alert, oriented to time, person, place. Cooperative. Memory is good.   PSYCHIATRIC: No significant confusion, agitation, depression noted.   LABORATORY, DIAGNOSTIC, AND RADIOLOGICAL DATA: Basic metabolic panel showed glucose 346, sodium 135, otherwise unremarkable. Patient's liver enzymes were not taken. White blood cell count is elevated to 13.2, hemoglobin 13.0, platelet count 260. Urinalysis yellow cloudy urine, more than 500 glucose, negative for bilirubin or ketones, specific gravity 1.016, pH 6.0, 2+ blood, 30 mg/dL protein, positive for nitrites, 3+ leukocyte esterase, 56 red blood cells, 169 white blood cells, 1+ bacteria, less than 1 epithelial cell, white blood cell clumps as well as mucus is present. CT of abdomen and pelvis without  contrast 07/08/2012 showed prominent staghorn calculus on the right. No hydronephrosis is present. Right kidney is swollen with perinephric fat  plane streaking. These changes suggest the presence of pyelonephritis on right. Left nephrolithiasis was also noted.   ASSESSMENT AND PLAN:  1. Acute pyelonephritis. Admit patient to medical floor. Will get blood cultures as well as urine cultures. Will initiate antibiotics with Rocephin. Will continue IV fluids as well as pain medications and will adjust antibiotics according to culture results.  2. Kidney stones. Will refer patient to urologist, Dr. Yves Dill, upon discharge home unless urgent need for urology consultation in the hospital.  3. Leukocytosis. Follow with antibiotic therapy.  4. Dehydration as evidenced by low sodium level. Will continue IV fluids will follow. 5. Diabetes mellitus. Will continue metformin as well as IV fluids and sliding scale insulin.   TIME SPENT: 50 minutes.   ____________________________ Theodoro Grist, MD rv:cms D: 07/08/2012 12:45:45 ET T: 07/08/2012 13:56:51 ET  JOB#: [id number] cc: Theodoro Grist, MD, <Dictator> [facility], Unknown MD  [place] MD ELECTRONICALLY SIGNED 07/14/2012 1:12

## 2015-03-29 NOTE — Op Note (Signed)
PATIENT NAME:  Patricia Golden, Patricia Golden MR#:  [id number] DATE OF BIRTH:  04-10-1961  DATE OF PROCEDURE:  09/01/2012  PREOPERATIVE DIAGNOSIS: Right staghorn kidney stone.   POSTOPERATIVE DIAGNOSIS: Right staghorn kidney stone.   PROCEDURES:  1. Cystoscopy with right double pigtail stent placement.  2. Fluoroscopy.   SURGEON: Maryan Puls, MD   ANESTHETIST: Dr. Kayleen Memos and surgeon.  ANESTHESIA: General per Dr. Kayleen Memos and local per Dr. Yves Dill  INDICATIONS: See the dictated history and physical. After informed consent, the patient requests the above procedure.   OPERATIVE SUMMARY: After adequate general anesthesia had been obtained, the patient was placed into the dorsal lithotomy position and the perineum was prepped and draped in the usual fashion. The 21 French cystoscope was coupled with the camera and then visually advanced into the bladder. The bladder was thoroughly inspected. Both ureteral orifices were identified and had clear efflux. No bladder mucosal lesions were identified. Fluoroscopy confirmed the presence of a large staghorn stone on the right side. A 0.035 Glidewire was then fluoroscopically advanced up the right ureter and curled into the kidney. A 6 x 24 cm double pigtail stent was then advanced over the guidewire and positioned in the ureter. Guidewire was removed taking care to leave the stent in position. The bladder was then drained and the cystoscope was removed. 10 mL of viscous Xylocaine was instilled within the urethra and the bladder. A B and O suppository         was placed. The procedure was then terminated and the patient was transferred to the recovery room in stable condition. ____________________________ Otelia Limes. Yves Dill, MD mrw:slb D: 09/01/2012 09:44:41 ET T: 09/01/2012 10:06:39 ET JOB#: [id number]  cc: Otelia Limes. Yves Dill, MD, <Dictator> Royston Cowper MD ELECTRONICALLY SIGNED 09/01/2012 11:59

## 2015-03-29 NOTE — Op Note (Signed)
PATIENT NAME:  Patricia Golden, LARCOM MR#:  [id number] DATE OF BIRTH:  [birth year]  DATE OF PROCEDURE:  11/17/2012  PREOPERATIVE DIAGNOSIS: Nephrolithiasis.   POSTOPERATIVE DIAGNOSIS: Nephrolithiasis.   PROCEDURE: Cystoscopy with stent removal.   SURGEON: Otelia Limes. Yves Dill, MD  ANESTHETIST: [facility].   ANESTHETIC METHOD: General and local.   INDICATIONS: See the dictated history and physical. After informed consent, patient requests above procedure.   OPERATIVE SUMMARY: After adequate general anesthesia had been obtained, patient was placed into dorsal lithotomy position and the perineum was prepped and draped in the usual fashion. 48 French cystoscope was coupled with the camera and then placed into the bladder. Bladder was thoroughly inspected. Left orifice was identified and had clear efflux. Right orifice had stent present. No bladder lesions were identified. Stent was engaged with the alligator forceps and removed. 10 mL of viscous Xylocaine was instilled within the urethra and the bladder. B and O suppository was placed. Procedure was then terminated and the patient was transferred to the recovery room in stable condition.  ____________________________ Otelia Limes. Yves Dill, MD mrw:cms D: 11/17/2012 08:13:03 ET T: 11/17/2012 08:33:07 ET JOB#: [id number]  cc: Otelia Limes. Yves Dill, MD, <Dictator>  Royston Cowper MD ELECTRONICALLY SIGNED 11/18/2012 8:47

## 2015-03-29 NOTE — H&P (Signed)
PATIENT NAME:  Patricia Golden, Patricia Golden MR#:  [id number] DATE OF BIRTH:  [birth year]  DATE OF ADMISSION:  11/17/2012  CHIEF COMPLAINT: Kidney stones.   HISTORY OF PRESENT ILLNESS: Ms. Simonich is a 54 year old white female with bilateral kidney stone disease. She underwent lithotripsy on the left side in August and is stone free on that side. She subsequently underwent right stent placement September 23rd followed by three lithotripsies on that side and has minimal residual stone burden present at this time. She comes in now for stent removal.   ALLERGIES: Percocet and codeine.   CURRENT MEDICATIONS:  1. Advair Diskus. 2. Crestor. 3. Glipizide. 4. Lisinopril. 5. Metformin. 6. ProAir HFA. 7. Tramadol. 8. Tylenol.   PAST SURGICAL HISTORY:  1. Tonsillectomy in 1964.  2. Breast reduction and abdominoplasty in 2000. 3. Anterior and posterior vaginal repair in 2008.  4. Hysterectomy in 2009.  5. Cholecystectomy in 2010. 6. L5-S4 disk surgery with fusion in 2012.   SOCIAL HISTORY: She denied tobacco use. She drinks alcohol rarely.   FAMILY HISTORY: Remarkable for parents with diabetes, hyperlipidemia, liver disease, and heart disease.   PAST AND CURRENT MEDICAL CONDITIONS:  1. Diabetes. 2. Chronic back pain.  3. Allergic rhinitis.  4. Arthritis.  5. Asthma.  6. Migraine headaches.  7. Hypercholesterolemia.   REVIEW OF SYSTEMS: The patient has chronic insomnia and occasional diarrhea. She denied chest pain or shortness of breath.   PHYSICAL EXAMINATION:   GENERAL: Well nourished white female in no acute distress.   HEENT: Sclerae were clear. Pupils were equally round and reactive to light and accommodation. Extraocular movements are intact.   NECK: Supple. No palpable cervical adenopathy.   LUNGS: Clear to auscultation.   CARDIOVASCULAR: Regular rhythm and rate without audible murmurs.   ABDOMEN: Soft, nontender abdomen.   GU/RECTAL: Deferred.   NEUROMUSCULAR: Alert and  oriented x3.   IMPRESSION: Bilateral stone disease with right stent present.   PLAN: Cysto with right stent removal.   ____________________________ Otelia Limes. Yves Dill, MD mrw:drc D: 11/12/2012 08:38:14 ET T: 11/12/2012 08:48:01 ET JOB#: [id number] Otelia Limes WOLFF MD ELECTRONICALLY SIGNED 11/12/2012 14:55

## 2015-03-29 NOTE — H&P (Signed)
PATIENT NAME:  Patricia Golden, Patricia Golden MR#:  [id number] DATE OF BIRTH:  Jun 04, 1961  DATE OF ADMISSION:  09/01/2012  CHIEF COMPLAINT: Kidney stone.   HISTORY OF PRESENT ILLNESS: Ms. Fehr is a 54 year old white female with bilateral kidney stone disease. She has a right staghorn stone and a left renal stone. The left renal stone was treated with lithotripsy on August 29th, and she is now stone free on that side. She comes in now for cystoscopy with right stent placement in preparation for treatment of the staghorn stone on that side.   ALLERGIES: Percocet and codeine.   CURRENT MEDICATIONS: Advair Diskus, Crestor, glipizide, lisinopril, metformin,  ProAir HFA, tramadol and Tylenol.   PAST SURGICAL HISTORY:  1. Tonsillectomy in 1964.  2. Breast reduction and abdominoplasty in 2000.  3. A & P  vaginal repair in 2008.  4. Hysterectomy in 2009.  5. Cholecystectomy in 2010.  6. L5-S1disk surgery and fusion in 2012.   SOCIAL HISTORY: She denied tobacco use. She consumes alcohol rarely.   FAMILY HISTORY: Remarkable for parents with diabetes, hyperlipidemia, liver disease and heart disease.   PAST AND CURRENT MEDICAL CONDITIONS:  1. Diabetes. 2. Chronic back pain.  3. Allergic rhinitis.  4. Arthritis.  5. Asthma.  6. Migraine headaches.  7. Hypercholesterolemia.   REVIEW OF SYSTEMS: The patient has chronic insomnia and occasional diarrhea. She denied chest pain or shortness of breath.   PHYSICAL EXAMINATION:  GENERAL: Well-nourished white female in no distress.   HEENT: Sclerae were clear. Pupils were equally round and reactive to light and accommodation. Extraocular movements were intact.   NECK: Supple. No palpable cervical adenopathy. No audible carotid bruits.   LUNGS: Clear to auscultation.   CARDIOVASCULAR: Regular rhythm and rate without audible murmurs.   ABDOMEN: Soft, nontender abdomen.   GU/RECTAL: Exam deferred.   NEUROMUSCULAR: Alert and oriented x3, nonfocal.    IMPRESSION: Right staghorn stone.   PLAN: Cystoscopy with right stent placement.   ____________________________ Otelia Limes. Yves Dill, MD mrw:cbb D: 08/29/2012 12:24:42 ET T: 08/29/2012 12:33:17 ET JOB#: [id number]  cc: Otelia Limes. Yves Dill, MD, <Dictator> Royston Cowper MD ELECTRONICALLY SIGNED 08/30/2012 13:03

## 2015-04-01 NOTE — H&P (Signed)
PATIENT NAME:  Patricia Golden, Patricia Golden MR#:  [id number] DATE OF BIRTH:  03/17/1961  DATE OF ADMISSION:  12/07/2013  CHIEF COMPLAINT: Kidney stones and flank pain.   HISTORY OF PRESENT ILLNESS: Patricia Golden is a 54 year old white female who presented with right ureteral obstruction due to multiple right ureteral stones December 12th. She underwent cystoscopy with stent placement at that time. She comes in now for right ureteroscopic ureterolithotomy with holmium laser lithotripsy. According to CT scan report, she had 5 stones along the course of the ureter and a renal pelvic stone measuring from 6 to 11 mm each.   ALLERGIES: PERCOCET AND CODEINE.   CURRENT MEDICATIONS: Include Advair Diskus, Crestor, glipizide, lisinopril, metformin, Pro Air HFA, tramadol, and Tylenol.   PAST SURGICAL HISTORY:  1.  Tonsillectomy, 1964. 2.  Breast reduction and abdominoplasty, 2000.  3.  Anterior and posterior vaginal repair, 2008.  4.  Hysterectomy, 2009. 5.  Cholecystectomy, 2010. 6.  L5-S4 disk fusion 2012. 7.  ESWL, 2013.   PAST AND CURRENT MEDICAL CONDITIONS:  1.  Diabetes.  2.  Chronic back pain.  3.  Allergic rhinitis.  4.  Arthritis.  5.  Asthma.  6.  Migraine headaches.  7.  Hypercholesterolemia.   REVIEW OF SYSTEMS: The patient has chronic insomnia and occasional diarrhea. She denied chest pain or shortness of breath.   PHYSICAL EXAMINATION: GENERAL: A well-nourished white female in no acute distress.  HEENT: Sclerae were clear. Pupils were equally round and reactive to light and accommodation.  NECK: Supple. No palpable cervical adenopathy. No palpable thyroid masses. No audible carotid bruits.  LUNGS: Clear to auscultation.  CARDIOVASCULAR: Regular rhythm and rate without audible murmurs.  ABDOMEN: Soft, nontender abdomen.  GENITOURINARY AND RECTAL: Deferred.  NEUROMUSCULAR: Alert and oriented x3.   IMPRESSION: Right ureterolithiasis and right nephrolithiasis.   PLAN: Right ureteroscopic  ureterolithotomy with holmium laser lithotripsy.   ____________________________ Otelia Limes. Yves Dill, MD mrw:sb D: 12/01/2013 13:13:40 ET T: 12/01/2013 13:36:00 ET JOB#: [id number]  cc: Otelia Limes. Yves Dill, MD, <Dictator> Royston Cowper MD ELECTRONICALLY SIGNED 12/02/2013 8:30

## 2015-04-01 NOTE — H&P (Signed)
Subjective/Chief Complaint Kidney stone   History of Present Illness She presented to the emergency Department last night with a three-hour history of severe right flank and right lower quadrant pain.  She had chills with no documented fever.  She denies frequency, urgency or dysuria.  CT scan performed in the emergency department was remarkable for severe right hydroureteronephrosis approximate 5 stones in the mid and distal ureter.  There was also a nonobstructing 15 mm right renal pelvic calculus.  Urinalysis showed significant pyuria.  She was started on IV antibiotics.  She was admitted for parenteral analgesia, antibiotics and ureteral stent placement. Past urologic history remarkable for recurrent stone disease.  She was treated by Dr. Yves Dill was shockwave lithotripsy and August 2013 for a left sided stone.  She underwent placement of a right ureteral stent in September 2013 for a right staghorn stone and underwent shockwave lithotripsy in September, October, November for this stone.  Her ureteral stent was removed in December 2013.  She states she has had no problems since that time.   Past History 1. Diabetes. 2. Chronic back pain.  3. Allergic rhinitis.  4. Arthritis.  5. Asthma.  6. Migraine headaches.  7. Hypercholesterolemia.   PAST SURGICAL HISTORY:  1. Tonsillectomy in 1964.  2. Breast reduction and abdominoplasty in 2000. 3. Anterior and posterior vaginal repair in 2008.  4. Hysterectomy in 2009.  5. Cholecystectomy in 2010. 6. L5-S4 disk surgery with fusion in 2012.   Past Med/Surgical Hx:  Hypercholesterolemia:   Arthritis:   UTI:   Anemia: h/o  Asthma:   Migraines:   Lumbar Disectomy:   diabetes:   Kidney Stones:   Cystoscopy, Right Stent Placement:   Bladder suspension:   Baker's Cyst:   Abdominoplasy, Breast Reduction:   Cholecystectomy:   CSF leak after back surgery:   Spinal Fusion:   Lumbar Laminectomy:   Lithotriopsy:   Hysterectomy:    ALLERGIES:  Codeine: N/V/Diarrhea  HOME MEDICATIONS: Medication Instructions Status  metformin 500 mg oral tablet 2 tab(s) orally 2 times a day Active  Crestor 20 mg oral tablet 1 tab(s) orally once a day (at bedtime) Active  Advair Diskus 500 mcg-50 mcg inhalation powder 1 puff(s) inhaled once a day (in the morning) Active  ProAir HFA 2 puff(s) inhaled every 4 hours, As Needed Active  Victoza 18 mg/3 mL subcutaneous solution 1.8 unit(s) subcutaneous once a day Active   Family and Social History:  Family History Non-Contributory   Social History negative tobacco, positive ETOH   Place of Living Home   Review of Systems:  Fever/Chills Yes   Cough No   Sputum No   Abdominal Pain Yes   Diarrhea No   Constipation No   Nausea/Vomiting Yes   SOB/DOE Yes  Bronchitis   Chest Pain No   Dysuria No   Medications/Allergies Reviewed Medications/Allergies reviewed   Physical Exam:  GEN well developed, well nourished, in mild distress secondary to right  flank pain   HEENT moist oral mucosa   NECK supple   RESP clear BS   CARD regular rate  no murmur   ABD mild rlq tenderness   EXTR negative edema   SKIN skin turgor good   NEURO motor/sensory function intact   PSYCH alert, A+O to time, place, person, good insight   Lab Results: Hepatic:  11-Dec-14 22:08   Bilirubin, Total  2.4  Alkaline Phosphatase 104 (45-117 NOTE: New Reference Range 10/30/13)  SGPT (ALT) 44  SGOT (AST) 30  Total Protein, Serum 7.2  Albumin, Serum 3.8  Routine Chem:  11-Dec-14 22:08   Glucose, Serum  173  BUN 7  Creatinine (comp) 0.90  Sodium, Serum 139  Potassium, Serum 3.5  Chloride, Serum 107  CO2, Serum 24  Calcium (Total), Serum 9.4  Osmolality (calc) 280  eGFR (African American) >60  eGFR (Non-African American) >60 (eGFR values <56m/min/1.73 m2 may be an indication of chronic kidney disease (CKD). Calculated eGFR is useful in patients with stable renal  function. The eGFR calculation will not be reliable in acutely ill patients when serum creatinine is changing rapidly. It is not useful in  patients on dialysis. The eGFR calculation may not be applicable to patients at the low and high extremes of body sizes, pregnant women, and vegetarians.)  Anion Gap 8   Radiology Results: CT:    11-Dec-14 23:47, CT Abdomen Pelvis WO for Stone  CT Abdomen Pelvis WO for Stone  REASON FOR EXAM:    R flank pain, severe, decreased UO  COMMENTS:       PROCEDURE: CT  - CT ABDOMEN /PELVIS WO (STONE)  - Nov 19 2013 11:47PM     CLINICAL DATA:  Right flank pain began this afternoon. The patient  is voiding small amounts. Past medical history is significant for  kidney stones. History of lithotripsy 7 times last year. Prior  cholecystectomy, hysterectomy.    EXAM:  CT ABDOMEN AND PELVIS WITHOUT    TECHNIQUE:  Multidetector CT imaging of the abdomen and pelvis was performed  following the standard protocol without IV contrast.    COMPARISON:  CT of the abdomen and pelvis on 07/08/2012    FINDINGS:  Lung bases are unremarkable.    There is marked right-sided hydronephrosis and perinephric  stranding. Dilated, tortuous right ureter contains numerous  calculated. At least 5 stones are identified within the dilated  ureter, measuring 10 mm, 8 mm, 6 mm, 11 mm, and 7 mm from cephalad  to caudal. Large calcifications also identified within the right  renal pelvis, measuring 11 x14 mm.    No focal abnormality identified within the liver, spleen, pancreas,  adrenal glands, or left kidney. No intrarenal stones or ureteral  stones are identified on the left. The patient has had previous  cholecystectomy.    The stomach and small bowel loops are normal in appearance. The  appendix is normal in appearance. There are scattered diverticula in  the colon. Otherwise, the colon is normal with moderate stool  burden.    The uterus is absent. There is no free  pelvic fluid. No adnexal  mass. No evidence for aortic aneurysm. No retroperitoneal or  mesenteric adenopathy. The patient has had previous posterior lumbar  fusion. No suspicious lytic or blastic lesions are identified.     IMPRESSION:  1. Marked right hydronephrosis and perinephric stranding.  2. Dilated of the tortuous right ureter containing at least 5  discrete stones, measuring up to 11 mm in diameter.  3. 11 x 14 mm right renal pelvis stone, not currently obstructing.  4. Diverticulosis without diverticulitis  5.Status post hysterectomy and cholecystectomy.  6. Status post lumbar fusion.      Electronically Signed    By: BShon HaleM.D.    On: 11/20/2013 00:08         Verified By: EGlenice Bow M.D.,    Assessment/Admission Diagnosis Right renal colic secondary to multiple right ureteral calculi.   Plan Urinalysis today shows significant pyuria and urine cultures  pending.  Although afebrile and stable would remain minimal stone manipulation pending the cultures.  Have recommended cystoscopy with placement of a right ureteral stent.  The indications and procedure were discussed including potential risks of bleeding, infection and ureteral injury.  The possibility of inability to place stent secondary to stone impaction was discussed.  In this event she would require placement of a percutaneous nephrostomy tube.  She indicated all questions were answered to his satisfaction and desires to proceed.   Electronic Signatures: Abbie Sons (MD)  (Signed 12-Dec-14 09:09)  Authored: CHIEF COMPLAINT and HISTORY, PAST MEDICAL/SURGIAL HISTORY, ALLERGIES, HOME MEDICATIONS, FAMILY AND SOCIAL HISTORY, REVIEW OF SYSTEMS, PHYSICAL EXAM, LABS, Radiology, ASSESSMENT AND PLAN   Last Updated: 12-Dec-14 09:09 by Abbie Sons (MD)

## 2015-04-01 NOTE — Op Note (Signed)
PATIENT NAME:  Patricia Golden, Patricia Golden MR#:  [id number] DATE OF BIRTH:  03-15-1961  DATE OF PROCEDURE:  11/20/2013  PREOPERATIVE DIAGNOSES: 1.  Right renal colic.  2.  Multiple right ureteral calculi.  3.  Pyuria.   POSTOPERATIVE DIAGNOSES:  1.  Right renal colic.  2.  Multiple right ureteral calculi.  3.  Pyuria.   PROCEDURE: Cystoscopy with placement of right ureteral stent.   SURGEON: John Giovanni, MD  ASSISTANT: None.   ANESTHESIA: General.   INDICATIONS: This is a 54 year old female who presented to the Emergency Department 5 hours prior to admission complaining of severe right lower quadrant abdominal pain. Stone protocol CT was remarkable for severe right hydronephrosis/hydroureter and multiple right ureteral calculi. She had leukocytosis and significant pyuria. She was afebrile and normotensive. Placement of a ureteral stent has been recommended for which she agrees.   DESCRIPTION OF PROCEDURE: The patient was taken to the cystoscopy suite and placed on the table in the supine position where a general anesthetic was administered. She was then placed in the low lithotomy position and her external genitalia were prepped and draped in the usual fashion. Timeout was performed per hospital protocol with all in agreement. A 21-French cystoscope sheath with obturator was lubricated and passed per urethra. Panendoscopy was performed. There were no bladder mucosal abnormalities noted. The left ureteral orifice was normal appearing with clear efflux. No efflux was seen from the right ureteral orifice. A 0.035 Glidewire was placed through the cystoscope and into the right ureteral orifice. The wire was easily negotiated up into the right renal pelvis. A 5-French open-ended ureteral catheter was then placed over the wire to the vicinity of the proximal ureter. Retrograde pyelogram was performed which does show marked right hydronephrosis and hydroureter. A nonobstructing 10 mm calcification was seen in  the renal pelvis. There was a 10 mm obstructing stone in the right proximal ureter. UPJ was measured at approximately 24 cm. The guidewire was replaced and the ureteral catheter was removed. A 6-French/24 cm Contour stent was placed without difficulty. There was good curl seen in the renal pelvis under fluoroscopy. The distal end of the stent was well positioned in the bladder under direct visualization. There was good efflux of urine seen from the orifice and through the stent after placement. In addition, urine was collected from the right renal pelvis after placement of the ureteral catheter and sent for culture. The urine was grossly clear. A B and O suppository was placed per rectum. The patient was taken to PAC-U in stable condition. There were no complications. EBL was zero. ____________________________ Ronda Fairly Bernardo Heater, MD scs:sb D: 11/20/2013 15:21:01 ET T: 11/20/2013 15:52:00 ET JOB#: [id number]  cc: Nicki Reaper C. Bernardo Heater, MD, <Dictator> Abbie Sons MD ELECTRONICALLY SIGNED 11/25/2013 7:33

## 2015-04-01 NOTE — Op Note (Signed)
PATIENT NAME:  Patricia Golden, Patricia Golden MR#:  [id number] DATE OF BIRTH:  [birth year]  DATE OF PROCEDURE:  12/07/2013  PREOPERATIVE DIAGNOSIS: Right ureterolithiasis and right nephrolithiasis.   POSTOPERATIVE DIAGNOSIS: Right ureterolithiasis.   PROCEDURE:  1.  Right ureteroscopic ureterolithotomy with holmium laser lithotripsy.  2.  Right double pigtail stent removal and replacement.  3.  Fluoroscopy.   SURGEON: Maryan Puls, M.D.   ANESTHETIST: Dr. Benjamine Mola and Dr. Yves Dill.  ANESTHETIC METHOD: General per Dr. Benjamine Mola and local per Dr. Yves Dill.   INDICATIONS: See the dictated history and physical. After informed consent, the patient requests the above procedures.   OPERATIVE SUMMARY: After adequate general anesthesia had been obtained, the patient was placed into dorsal lithotomy position and the perineum was prepped and draped in the usual fashion. Fluoroscopy confirmed the presence of a right stent. There appeared to be a 9 to 10 mm stone in the upper right ureter. The smaller stones were not visualized. At this point, the 21-French cystoscope was coupled with the camera and then placed into the bladder. The bladder was thoroughly inspected. Right ureteral orifice was edematous with stent present. No bladder mucosal lesions were identified. Left orifice was normal with clear efflux. At this point, the alligator forceps were used to engage the right stent and the stent was pulled out to the urethral meatus. A 0.035 Glidewire was then passed up the stent and curled up into the renal pelvis under fluoroscopic guidance. The stent was removed taking care to leave the guidewire in position. At this point, the intramural ureter was dilated with the ureteral access sheath. The access sheath was then removed taking care to leave the guidewire in position. The mini Storz ureteroscope was then coupled with the camera and then visually advanced into the right ureter. Initial stone was encountered at the mid ureter and  measured approximately 6 mm. The stone was then disintegrated with the 365 micron holmium laser. A second 6 mm stone was identified just proximal to the first one and it was also disintegrated with the laser. A third stone, very similar in size, was also encountered proximal to this and it was disintegrated with the laser. A fourth  stone measuring 6 mm was encountered in the upper ureter and it measured 6 mm as well and it was disintegrated with the laser. Finally, a 10 mm stone was encountered near the right UPJ and it was disintegrated with the laser. The scope was then passed into the renal pelvis and no residual stones were identified. At this point, the ureteroscope was removed taking care to leave the guidewire in position. The cystoscope was then back loaded over the guidewire and a 6 x 24 cm double pigtail stent was advanced over the guidewire and positioned in the ureter. Guidewire was then removed taking care to leave the stent in good position. The bladder was drained and scope was removed. 10 mL of viscous Xylocaine was instilled within the urethra. A B and O suppository was placed. The procedure was then terminated and the patient was transferred to the recovery room in stable condition.   ____________________________ Otelia Limes. Yves Dill, MD mrw:aw D: 12/07/2013 12:36:41 ET T: 12/07/2013 13:43:12 ET JOB#: [id number]  cc: Otelia Limes. Yves Dill, MD, <Dictator> Royston Cowper MD ELECTRONICALLY SIGNED 12/07/2013 18:00

## 2015-04-02 NOTE — Discharge Summary (Signed)
PATIENT NAME:  Patricia Golden, Patricia Golden MR#:  [id number] DATE OF BIRTH:  1961-02-21  DATE OF ADMISSION:  11/20/2013 DATE OF DISCHARGE:  11/22/2013  ADMISSION DIAGNOSES:  1. Right ureteral calculi.  2. Right renal colic.  3. Urinary tract infection.   DISCHARGE DIAGNOSES:  1. Right ureteral calculi.  2. Right renal colic.  3. Urinary tract infection.  4. Pyelonephritis.   PROCEDURES: On 11/20/2013, cystoscopy with placement of right ureteral stent.   HISTORY OF PRESENT ILLNESS: A 54 year old female who presented to the Emergency Department with a 3-hour history of severe right flank pain radiating to the right lower quadrant. CT showed severe right hydroureteronephrosis with approximately 5 stones in the mid and distal ureter. She also had a nonobstructing 15 mm right renal pelvic calculus. Urinalysis did show significant pyuria, although she was afebrile. Refer to the admission H and P for details.   HOSPITAL COURSE: The patient was started on IV antibiotics. She was taken to the operating room on December 12, where she underwent placement of a right ureteral stent without difficulty. On the evening of the procedure, she did spike a temperature to 103 degrees. She was maintained on IV antibiotics and defervesced within 48 hours. On December 14, the day of discharge, she was afebrile. Her pain had resolved.   DISPOSITION: She is discharged to home. Her regular urologist is Dr. Yves Dill, and she will contact his office for followup and stone treatment.   DISCHARGE MEDICATIONS:  1. Nucynta 50 mg q.6 hours p.r.n. pain.  2. Zofran 8 mg q.4 hours p.r.n. 3. Hyoscyamine 0.125 mg sublingually p.r.n. stent pain.   CONDITION AT DISCHARGE: Good.   PROGNOSIS: Good.   ____________________________ Ronda Fairly. Bernardo Heater, MD scs:lb D: 12/09/2013 12:53:00 ET T: 12/09/2013 13:11:41 ET JOB#: [id number]  cc: Nicki Reaper C. Bernardo Heater, MD, <Dictator> Abbie Sons MD ELECTRONICALLY SIGNED 12/23/2013 8:30

## 2015-04-04 LAB — SURGICAL PATHOLOGY

## 2015-04-11 ENCOUNTER — Ambulatory Visit: Payer: BC Managed Care – PPO | Admitting: Internal Medicine

## 2015-04-26 ENCOUNTER — Ambulatory Visit: Payer: BC Managed Care – PPO

## 2015-06-02 ENCOUNTER — Telehealth: Payer: Self-pay

## 2015-06-02 NOTE — Telephone Encounter (Signed)
Diabetic Bundle. Left voicemail advising pt her A1C blood test is due. Pt advised to contact PCP's office to schedule.
